# Patient Record
Sex: Female | Born: 1960 | Race: Black or African American | Hispanic: No | Marital: Single | Smoking: Never smoker
Health system: Southern US, Community
[De-identification: ages and names within clinical notes are randomized; demographics above are authoritative.]

## PROBLEM LIST (undated history)

## (undated) ENCOUNTER — Emergency Department (HOSPITAL_COMMUNITY): Admission: EM | Disposition: A | Discharge status: Home / Self Care | Payer: 59

## (undated) DIAGNOSIS — D649 Anemia, unspecified: Secondary | ICD-10-CM

## (undated) DIAGNOSIS — F329 Major depressive disorder, single episode, unspecified: Secondary | ICD-10-CM

## (undated) DIAGNOSIS — I671 Cerebral aneurysm, nonruptured: Secondary | ICD-10-CM

## (undated) DIAGNOSIS — R42 Dizziness and giddiness: Secondary | ICD-10-CM

## (undated) DIAGNOSIS — G4730 Sleep apnea, unspecified: Secondary | ICD-10-CM

## (undated) DIAGNOSIS — T7840XA Allergy, unspecified, initial encounter: Secondary | ICD-10-CM

## (undated) DIAGNOSIS — E669 Obesity, unspecified: Secondary | ICD-10-CM

## (undated) DIAGNOSIS — F32A Depression, unspecified: Secondary | ICD-10-CM

## (undated) DIAGNOSIS — I10 Essential (primary) hypertension: Secondary | ICD-10-CM

## (undated) HISTORY — DX: Essential (primary) hypertension: I10

## (undated) HISTORY — DX: Allergy, unspecified, initial encounter: T78.40XA

## (undated) HISTORY — PX: SHOULDER SURGERY: SHX246

## (undated) HISTORY — DX: Major depressive disorder, single episode, unspecified: F32.9

## (undated) HISTORY — DX: Dizziness and giddiness: R42

## (undated) HISTORY — DX: Obesity, unspecified: E66.9

## (undated) HISTORY — DX: Cerebral aneurysm, nonruptured: I67.1

## (undated) HISTORY — DX: Anemia, unspecified: D64.9

## (undated) HISTORY — PX: ABDOMINAL HYSTERECTOMY: SHX81

## (undated) HISTORY — PX: WISDOM TOOTH EXTRACTION: SHX21

## (undated) HISTORY — DX: Depression, unspecified: F32.A

## (undated) HISTORY — DX: Sleep apnea, unspecified: G47.30

## (undated) HISTORY — PX: KNEE ARTHROSCOPY: SUR90

---

## 1996-12-10 HISTORY — PX: BREAST REDUCTION SURGERY: SHX8

## 1998-09-02 ENCOUNTER — Emergency Department (HOSPITAL_COMMUNITY): Admission: EM | Admit: 1998-09-02 | Discharge: 1998-09-02 | Payer: Self-pay | Admitting: Emergency Medicine

## 1998-11-16 ENCOUNTER — Other Ambulatory Visit: Admission: RE | Admit: 1998-11-16 | Discharge: 1998-11-16 | Payer: Self-pay | Admitting: Obstetrics and Gynecology

## 1998-12-02 ENCOUNTER — Emergency Department (HOSPITAL_COMMUNITY): Admission: EM | Admit: 1998-12-02 | Discharge: 1998-12-02 | Payer: Self-pay | Admitting: *Deleted

## 1999-11-17 ENCOUNTER — Emergency Department (HOSPITAL_COMMUNITY): Admission: EM | Admit: 1999-11-17 | Discharge: 1999-11-17 | Payer: Self-pay | Admitting: *Deleted

## 1999-12-13 ENCOUNTER — Other Ambulatory Visit: Admission: RE | Admit: 1999-12-13 | Discharge: 1999-12-13 | Payer: Self-pay | Admitting: Obstetrics and Gynecology

## 2000-01-03 ENCOUNTER — Emergency Department (HOSPITAL_COMMUNITY): Admission: EM | Admit: 2000-01-03 | Discharge: 2000-01-04 | Payer: Self-pay | Admitting: Emergency Medicine

## 2000-12-31 ENCOUNTER — Other Ambulatory Visit: Admission: RE | Admit: 2000-12-31 | Discharge: 2000-12-31 | Payer: Self-pay | Admitting: Obstetrics and Gynecology

## 2001-06-23 ENCOUNTER — Encounter (INDEPENDENT_AMBULATORY_CARE_PROVIDER_SITE_OTHER): Payer: Self-pay | Admitting: Specialist

## 2001-06-23 ENCOUNTER — Inpatient Hospital Stay (HOSPITAL_COMMUNITY): Admission: RE | Admit: 2001-06-23 | Discharge: 2001-06-27 | Payer: Self-pay | Admitting: Obstetrics and Gynecology

## 2001-06-26 ENCOUNTER — Encounter: Payer: Self-pay | Admitting: Obstetrics and Gynecology

## 2002-02-04 ENCOUNTER — Other Ambulatory Visit: Admission: RE | Admit: 2002-02-04 | Discharge: 2002-02-04 | Payer: Self-pay | Admitting: Obstetrics and Gynecology

## 2002-06-22 ENCOUNTER — Ambulatory Visit (HOSPITAL_BASED_OUTPATIENT_CLINIC_OR_DEPARTMENT_OTHER): Admission: RE | Admit: 2002-06-22 | Discharge: 2002-06-22 | Payer: Self-pay | Admitting: Orthopedic Surgery

## 2002-09-24 ENCOUNTER — Emergency Department (HOSPITAL_COMMUNITY): Admission: EM | Admit: 2002-09-24 | Discharge: 2002-09-24 | Payer: Self-pay | Admitting: Emergency Medicine

## 2003-04-01 ENCOUNTER — Other Ambulatory Visit: Admission: RE | Admit: 2003-04-01 | Discharge: 2003-04-01 | Payer: Self-pay | Admitting: Obstetrics and Gynecology

## 2006-01-24 ENCOUNTER — Other Ambulatory Visit: Admission: RE | Admit: 2006-01-24 | Discharge: 2006-01-24 | Payer: Self-pay | Admitting: Obstetrics and Gynecology

## 2006-02-12 ENCOUNTER — Encounter: Admission: RE | Admit: 2006-02-12 | Discharge: 2006-02-12 | Payer: Self-pay | Admitting: Obstetrics and Gynecology

## 2006-12-24 ENCOUNTER — Ambulatory Visit: Payer: Self-pay | Admitting: Family Medicine

## 2007-01-11 ENCOUNTER — Emergency Department (HOSPITAL_COMMUNITY): Admission: EM | Admit: 2007-01-11 | Discharge: 2007-01-11 | Payer: Self-pay | Admitting: Family Medicine

## 2007-01-24 ENCOUNTER — Emergency Department (HOSPITAL_COMMUNITY): Admission: EM | Admit: 2007-01-24 | Discharge: 2007-01-24 | Payer: Self-pay | Admitting: Family Medicine

## 2007-06-24 ENCOUNTER — Ambulatory Visit: Payer: Self-pay | Admitting: Family Medicine

## 2007-08-11 ENCOUNTER — Emergency Department (HOSPITAL_COMMUNITY): Admission: EM | Admit: 2007-08-11 | Discharge: 2007-08-11 | Payer: Self-pay | Admitting: Emergency Medicine

## 2007-09-09 ENCOUNTER — Ambulatory Visit: Payer: Self-pay | Admitting: Family Medicine

## 2008-03-15 ENCOUNTER — Ambulatory Visit: Payer: Self-pay | Admitting: Family Medicine

## 2008-03-23 ENCOUNTER — Encounter: Admission: RE | Admit: 2008-03-23 | Discharge: 2008-03-23 | Payer: Self-pay | Admitting: Obstetrics and Gynecology

## 2008-07-26 ENCOUNTER — Ambulatory Visit: Payer: Self-pay | Admitting: Family Medicine

## 2008-07-30 ENCOUNTER — Ambulatory Visit: Payer: Self-pay | Admitting: Cardiovascular Disease

## 2008-08-12 ENCOUNTER — Ambulatory Visit: Payer: Self-pay

## 2008-08-12 ENCOUNTER — Encounter: Payer: Self-pay | Admitting: Cardiovascular Disease

## 2008-08-13 ENCOUNTER — Ambulatory Visit: Payer: Self-pay

## 2009-04-29 ENCOUNTER — Emergency Department (HOSPITAL_COMMUNITY): Admission: EM | Admit: 2009-04-29 | Discharge: 2009-04-29 | Payer: Self-pay | Admitting: Emergency Medicine

## 2009-06-28 ENCOUNTER — Encounter: Admission: RE | Admit: 2009-06-28 | Discharge: 2009-08-04 | Payer: Self-pay | Admitting: Orthopedic Surgery

## 2010-11-08 ENCOUNTER — Emergency Department (HOSPITAL_COMMUNITY)
Admission: EM | Admit: 2010-11-08 | Discharge: 2010-11-08 | Payer: Self-pay | Source: Home / Self Care | Admitting: Family Medicine

## 2010-12-31 ENCOUNTER — Encounter: Payer: Self-pay | Admitting: Obstetrics and Gynecology

## 2011-04-24 NOTE — Assessment & Plan Note (Signed)
[facility]                            CARDIOLOGY OFFICE NOTE   NAME:Sewell, SYNDEY JASKOLSKI                      MRN:          [id number]  DATE:07/30/2008                            DOB:          11/13/1961    Ms. Pirozzi is a 50 year old long-term patient of Dr. Susann Givens.  She has  coronary risk factors including central obesity, hypertension, and  positive family history.   She has been having substernal chest pain.  The pain has been  particularly bad the last Saturday, Sunday, Monday.  The pain is  exertional.  It is sharp.  It is in the left side of her chest.  It  radiates to her left arm.  There is no associated diaphoresis.  She has  had exertional dyspnea; however, she is extremely sedentary.  She has  been overweight for some time and has had breast reduction.  She is  currently over 250 pounds.   She seems to think the pain is improving slightly, but she still has it  and it continues to be somewhat exertional.   Does not have a pleuritic component.  There is no cough.   No one in the family has been sick and she has not had any significant  fevers.  She has never had a stress test or workup of her heart.  Dr.  Susann Givens was concerned because of her chest pain and the fact that she  had nonspecific ST-T wave changes and left axis deviation with a  question of LVH on her ECG.   Her review of systems is otherwise negative.   Her past medical history is remarkable for hypertension, on therapy for  over 5 years, previous hysterectomy, previous C-section x2, and previous  breast reduction.   Cholesterol status is unknown.   She only drinks on occasion and does not smoke.  The patient is single.  She has 2 children.  Her 65 year old daughter lives with her.  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   She admits to being fairly sedentary and primarily watching TV when she  does not work.   Family history is positive with  father dying of cancer at age 55.  Mother still live and grandmother having premature coronary disease.   She is on Tiazac dose unknown, hydrochlorothiazide dose unknown.   She has no allergies.   PHYSICAL EXAMINATION:  GENERAL:  Remarkable for an overweight black  female in no distress.  VITAL SIGNS:  Blood pressure is 130/80, pulse is 80 and regular,  respiratory rate is 14 afebrile, and weight is 254.  HEENT:  Unremarkable.  NECK:  Carotids are without bruit.  No lymphadenopathy, no JVP  elevation.  LUNGS:  Clear.  Good diaphragmatic motion.  No wheezing.  HEART:  S1 and S2 with distant heart sounds.  PMI not palpable.  ABDOMEN:  Benign.  Bowel sounds positive.  No AAA.  No tenderness.  No  bruit.  No hepatosplenomegaly.  No hepatojugular reflux.  No tenderness.  EXTREMITIES:  Distal pulses intact.  No edema.  NEURO:  Nonfocal.  SKIN:  Warm and dry.  MUSCULOSKELETAL:  No muscular weakness.   EKG shows sinus rhythm with borderline LVH, nonspecific ST-T wave  changes.   IMPRESSION:  1. Chest pain with exertional component risk factors and an abnormal      EKG.  The patient will have a followup stress Myoview.  2. Dyspnea in the setting of probable hypertension and left      ventricular hypertrophy.  Check 2-D echocardiogram.  Rule out      hypertrophic cardiomyopathy and assess diastolic dysfunction.  3. Central obesity.  The patient needs more dietary counseling.  She      needs to get involved with an exercise program.  As long as her      stress test and echo are normal, she will be cleared to do any type      of exercise.  I encouraged her to join the [facility].   As long as her stress test and echo are normal, she will follow up with  Dr. Susann Givens who has known her for 14 years.     Noralyn Pick. Eden Emms, MD, [facility]  Electronically Signed    PCN/MedQ  DD: 07/30/2008  DT: 07/31/2008  Job #: [id number]

## 2011-04-27 NOTE — Op Note (Signed)
[place]  Patient:    Stephanie Best, Stephanie Best Visit Number: [id number] MRN: [id number]          Service Type: DSU Location: [facility] Attending Physician:  Alinda Deem Dictated by:   Alinda Deem, M.D. Proc. Date: 06/22/02 Admit Date:  06/22/2002                             Operative Report  PREOPERATIVE DIAGNOSIS:  Right knee lateral meniscal tear, anterior horn.  POSTOPERATIVE DIAGNOSIS:  Right knee lateral meniscal tear, anterior horn.  OPERATION PERFORMED:  Right knee arthroscopic lateral meniscectomy.  SURGEON:  Alinda Deem, M.D.  ASSISTANT:  Dorthula Matas, P.A.-C.  ANESTHESIA:  General LMA.  ESTIMATED BLOOD LOSS:  Minimal.  FLUID REPLACEMENT:  700 cc of crystalloid.  DRAINS:  None.  TOURNIQUET TIME:  None.  INDICATIONS FOR PROCEDURE:  The patient is a 50 year old woman whom I have followed for right knee pain now for a number of months.  She has an MRI proven anterior horn of the lateral meniscus tear that has failed to respond to conservative treatment with physical therapy, activity modification, anti-inflammatory medicines and she now desires elective removal of the lateral meniscal tear to relieve pain and increase function.  DESCRIPTION OF PROCEDURE:  The patient was identified by arm band and taken to the operating room at [facility] where the appropriate anesthetic monitors were attached and general LMA anesthesia induced with the patient in the supine position.  The lateral post applied to the table and the right lower extremity prepped and draped in the usual sterile fashion from the ankle to the midthigh.  Using a #11 blade, standard inferomedial and inferolateral peripatellar portals were then made allowing introduction of the arthroscope through the inferolateral portal and the outflow through the inferomedial portal.  We immediately identified mild inflammation of the synovium  which required light debridement in the suprapatellar pouch region to enhance visualization.  The articular cartilage was actually in quite good shape to the patella, trochlea, medial and lateral compartments.  Medial meniscus was intact as were the cruciate ligaments on the lateral side.  We identified anterior to straight lateral tearing of the lateral meniscus, radial tears. These were debrided back to stable margins with a 3.5 mm Gator sucker shaver as well as some tearing of the root of the lateral meniscus posteriorly and this was also debrided back to a stable margin.  The gutters were cleared, the popliteus was intact.  The knee was then washed out with normal saline solution and the arthroscopic instruments removed.  A dressing of Xeroform, 4 x 4 dressing sponges, Webril and an Ace wrap applied.  The patient was awakened and taken to the recovery room without difficulty. Dictated by:   Alinda Deem, M.D. Attending Physician:  Alinda Deem DD:  06/22/02 TD:  06/23/02 Job: [id number]

## 2011-04-27 NOTE — Op Note (Signed)
[facility]  Patient:    Stephanie Best, Stephanie Best                      MRN: [id number] Proc. Date: 06/23/01 Adm. Date:  [id number] Attending:  Frederich Balding                           Operative Report  PREOPERATIVE DIAGNOSES:       Menorrhagia and pelvic pain secondary to pelvic adhesion process.  POSTOPERATIVE DIAGNOSES:      Menorrhagia and pelvic pain secondary to pelvic adhesion process.  OPERATIVE PROCEDURE:          Exploratory laparotomy with total abdominal hysterectomy and lysis of adhesions.  SURGEON:                      Juluis Mire, M.D.  ASSISTANTWilley Blade, M.D.  ANESTHESIA:                   General endotracheal.  ESTIMATED BLOOD LOSS:         400 to 500 cc.  INTRAOPERATIVE BLOOD REPLACEMENT:  None.  COMPLICATIONS:                None.  INDICATIONS:                  Indications are dictated in history and physical.  DESCRIPTION OF PROCEDURE:     Patient was taken to the OR and placed in supine position.  After a satisfactory level of general endotracheal anesthesia was obtained, the abdomen, perineum and vagina were prepped out with Betadine and draped as a sterile field.  A prior low transverse skin incision was then identified and excised.  The incision was then extended through the subcutaneous tissue.  The anterior rectus fascia was identified and entered sharply and the incision in the fascia extended laterally.  Fascia was then taken off the muscle superiorly and inferiorly.  Rectus muscle was separated in the midline.  We were able to enter the peritoneum at that point and the incision in the peritoneum was extended both superiorly and inferiorly.  She did have marked adhesions from the anterior uterine wall to the anterior abdomen.  At this point in time, we looked at the right adnexa which was densely adherent with a muchly foreshortened right round ligament.  This round ligament was  clamped, cut and suture-ligated with 0 Vicryl.  We then were able to sharply dissect some of the adhesions between the anterior aspect of the uterus.  The uteroovarian pedicle was isolated, clamped, cut and doubly ligated, first with a free tie of 0 Vicryl, then a suture ligature of 0 Vicryl.  We then at this point in time put an OConnor-OSullivan retractor in place and packed the bowel contents superiorly out of the pelvic cavity. Next, the left round ligament was identified, clamped, cut and suture-ligated with 0 Vicryl.  The left uteroovarian pedicle was isolated, clamped, cut and doubly ligated, first with a free tie of 0 Vicryl, then a suture ligature of 0 Vicryl.  We then were able to sharply develop the bladder flap.  This again was markedly adherent.  Using the clamp-cut-and-tie technique with suture ligatures of 0 Vicryl, the parametrium was serially separated from the sides of the  uterus.  The uterosacral ligaments were then clamped, cut and suture-ligated with 0 Vicryl.  The vaginal angles were isolated, clamped, cut and the intervening vaginal mucosa was excised and the uterus passed off the operative field.  Vaginal angles were secured with suture ligatures of 0 Vicryl.  Vaginal cuff was then closed with a running-locking suture of 0 Vicryl.  Some bleeding was noted from the posterior aspect of the cuff and brought under control with figure-of-eights of 0 Vicryl.  With this, we had good hemostasis.  We thoroughly irrigated the pelvis.  We had excellent hemostasis and good support of the vaginal cuff.  Both ovaries were hemostatically intact and out of the pelvis.  She had a relatively shallow cul-de-sac and this was not ablated.  The appendix was visualized and noted to be unremarkable.  All packs and self-retaining retractors were removed.  The peritoneum and muscle were closed with a running suture of 3-0 Vicryl, fascia was closed with a running suture of 0 Panacryl, subcu was  closed with 3-0 Vicryl and skin was closed with staples and Steri-Strips.  Sponge, instrument and needle count was reported as correct by circulating nurse x 2. Foley catheter remained clear at the time of closure.  Patient tolerated the procedure well and once extubated, returned to the recovery room in good condition.DD:  06/23/01 TD:  06/23/01 Job: [id number]

## 2011-04-27 NOTE — H&P (Signed)
[facility]  Patient:    Stephanie Best, Stephanie Best                      MRN: [id number] Adm. Date:  [id number] Attending:  Frederich Balding                         History and Physical  HISTORY OF PRESENT ILLNESS:   The patient is a 50 year old gravida 2, para 2, married black female, who presents for a total abdominal hysterectomy.  In relation to the present admission, the patient has been having trouble with increasing menorrhagia and dysmenorrhea unresponsive to conservative management.  This has been associated with increasing anemia.  The patient was seen on January 22.  At that time she was describing two days of heavy flow, changing pads and tampons every hour, with increasing clots and cramping.  Her hemoglobin was noted to be at that time 8.0.  She was placed on iron sulfate supplementation and low-dose birth control pills for management.  We gradually increased her hemoglobin, but her blood pressure went up with the birth control pills and these had to subsequently be discontinued.  She began experiencing increasing bleeding again.  Ultrasound evaluation was basically unremarkable.  She had a previous laparoscopic evaluation for bilateral tubal ligation back in 1997 with the finding of extensive pelvic adhesions.  In view of this, the patient now presents for total abdominal hysterectomy.  ALLERGIES:                    She is allergic to CODEINE.  MEDICATIONS:                  None.  PAST MEDICAL HISTORY:         Usual childhood diseases, no significant sequelae. PAST SURGICAL HISTORY:        She has had two prior cesarean sections.  She has had bilateral breast reduction and previous laparoscopic bilateral tubal ligation. FAMILY HISTORY:               Noncontributory.  SOCIAL HISTORY:               No tobacco or alcohol use.  REVIEW OF SYSTEMS:            Noncontributory.  PHYSICAL EXAMINATION:  VITAL SIGNS:                  Patient  afebrile with stable vital signs.  HEENT:                        Patient is normocephalic.  Pupils equal, round and reactive to light and accommodation.  Extraocular movements are intact, sclerae and conjunctivae are clear.  Oropharynx clear.  NECK:                         Without thyromegaly.  BREASTS:                      Bilateral reduction surgery is noted.  No dominant mass, adenopathy, or nipple discharge.  CHEST:                        Lungs are clear.  CARDIAC:  Regular rhythm and rate, no murmurs or gallops.  ABDOMEN:                      Benign.  PELVIC:                       Normal external genitalia.  Vaginal mucosa clear.  Cervix unremarkable.  Uterus upper limits of normal size, slightly irregular, and somewhat fixed.  Adnexa unremarkable.  EXTREMITIES:                  Trace edema.  NEUROLOGIC:                   Grossly within normal limits.  IMPRESSION:                   Menorrhagia with associated dysmenorrhea, unresponsive to conservative management.  PLAN:                         Patient to undergo total abdominal hysterectomy. Risks of surgery have been discussed, including the risks of anesthesia.  The risk of infection.  The risk of hemorrhage, necessitating transfusion, with the risk of AIDS or hepatitis.  The risk of injury to adjacent organs, including bladder, bowel, or ureters, that could require further exploratory surgery.  The risks of deep venous thrombosis or pulmonary embolus.  The patient expressed an understanding of the indications and risks. DD:  06/23/01 TD:  06/23/01 Job: [id number]

## 2011-04-27 NOTE — Discharge Summary (Signed)
[facility]  Patient:    Stephanie Best, Stephanie Best                      MRN: [id number] Adm. Date:  [id number] Disc. Date: [id number] Attending:  Frederich Balding                           Discharge Summary  ADMITTING DIAGNOSES:          Menorrhagia and pelvic pain secondary to pelvic adhesions.  DISCHARGE DIAGNOSES:          Menorrhagia and pelvic pain secondary to pelvic adhesions.  OPERATIVE PROCEDURE:          Exploratory laparotomy with total abdominal hysterectomy and lysis of adhesions.  HISTORY AND PHYSICAL:         For complete history and physical please see dictated note.  HOSPITAL COURSE:              Patient was admitted, underwent total abdominal hysterectomy.  Extensive abdominopelvic adhesions were noted.  Ovaries were left in place.  Pathology on the surgical specimen is still pending. Postoperatively her postoperative hemoglobin was 9.2.  Preoperative was 11.6. Postoperatively her complication was a postoperative problem with abdominal distention as well as nausea and vomiting.  We did check electrolytes.  Her potassium was slightly depressed at 3.4.  Her CBC on the 18th showed hemoglobin 8.9 which was considered stable.  Her concern was that she was developing small bowel obstruction at that time.  However, abdominal x-rays revealed a large amount of air and fecal material in the right and transverse colon with gas down to the rectum.  There was no distended small bowel.  We subsequently treated this with a combination of suppositories and enemas with good results.  On her fourth postoperative day she was afebrile with stable vital signs.  Her abdomen was soft, nontender.  She was passing flatus.  Her low transverse incision was intact.  She was having no active vaginal bleeding.  She was tolerating liquids at that time.  The decision was to discharge home.  COMPLICATIONS:                Noted above.  CONDITION ON DISCHARGE:        Stable.  DISPOSITION:                  Patient is discharged home on Demerol if she needs it for pain, iron sulfate supplementation.  She is to call with increasing nausea, vomiting, abdominal pain, active vaginal bleeding, or fever.  She is to avoid heavy lifting, vaginal entry, driving a car.  She will follow-up in the office on Monday to reevaluate the incision, GI function, as well as remove staples. DD:  06/27/01 TD:  06/27/01 Job: [id number]

## 2011-05-12 ENCOUNTER — Encounter: Payer: Self-pay | Admitting: Family Medicine

## 2011-05-12 DIAGNOSIS — E669 Obesity, unspecified: Secondary | ICD-10-CM | POA: Insufficient documentation

## 2011-05-15 ENCOUNTER — Ambulatory Visit (INDEPENDENT_AMBULATORY_CARE_PROVIDER_SITE_OTHER): Payer: Medicaid Other | Admitting: Family Medicine

## 2011-05-15 ENCOUNTER — Encounter: Payer: Self-pay | Admitting: Family Medicine

## 2011-05-15 VITALS — BP 140/88 | HR 70 | Ht 64.5 in | Wt 229.0 lb

## 2011-05-15 DIAGNOSIS — I10 Essential (primary) hypertension: Secondary | ICD-10-CM | POA: Insufficient documentation

## 2011-05-15 DIAGNOSIS — Z0000 Encounter for general adult medical examination without abnormal findings: Secondary | ICD-10-CM

## 2011-05-15 DIAGNOSIS — J45909 Unspecified asthma, uncomplicated: Secondary | ICD-10-CM | POA: Insufficient documentation

## 2011-05-15 DIAGNOSIS — E669 Obesity, unspecified: Secondary | ICD-10-CM

## 2011-05-15 DIAGNOSIS — Z79899 Other long term (current) drug therapy: Secondary | ICD-10-CM

## 2011-05-15 LAB — POCT URINALYSIS DIPSTICK
Glucose, UA: NEGATIVE
Leukocytes, UA: NEGATIVE
Protein, UA: NEGATIVE
Urobilinogen, UA: NEGATIVE

## 2011-05-15 LAB — CBC WITH DIFFERENTIAL/PLATELET
Eosinophils Absolute: 0.1 10*3/uL (ref 0.0–0.7)
HCT: 41.5 % (ref 36.0–46.0)
Hemoglobin: 14.2 g/dL (ref 12.0–15.0)
Lymphs Abs: 2.2 10*3/uL (ref 0.7–4.0)
MCH: 28.4 pg (ref 26.0–34.0)
MCHC: 34.2 g/dL (ref 30.0–36.0)
Monocytes Absolute: 0.3 10*3/uL (ref 0.1–1.0)
Monocytes Relative: 5 % (ref 3–12)
Neutrophils Relative %: 58 % (ref 43–77)
RBC: 5 MIL/uL (ref 3.87–5.11)

## 2011-05-15 LAB — LIPID PANEL
Total CHOL/HDL Ratio: 3 Ratio
VLDL: 14 mg/dL (ref 0–40)

## 2011-05-15 LAB — BASIC METABOLIC PANEL
BUN: 9 mg/dL (ref 6–23)
CO2: 28 mEq/L (ref 19–32)
Calcium: 9.3 mg/dL (ref 8.4–10.5)
Glucose, Bld: 89 mg/dL (ref 70–99)
Potassium: 3.7 mEq/L (ref 3.5–5.3)
Sodium: 137 mEq/L (ref 135–145)

## 2011-05-15 MED ORDER — HYDROCHLOROTHIAZIDE 12.5 MG PO CAPS: 12.5000 mg | ORAL_CAPSULE | Freq: Every day | ORAL | Status: DC

## 2011-05-15 MED ORDER — DILTIAZEM HCL ER BEADS 120 MG PO CP24: 120.0000 mg | ORAL_CAPSULE | Freq: Every day | ORAL | Status: DC

## 2011-05-15 NOTE — Patient Instructions (Signed)
Stay on your present medications. Call Hospice for bereavement counseling for both you and your daughter

## 2011-05-15 NOTE — Progress Notes (Signed)
  Subjective:    Patient ID: Stephanie Best, female    DOB: 07-Dec-1961, 50 y.o.   MRN: [id number]  HPI he is here for complete examination. She continues on her blood pressure medications area she has had no difficulty with her asthma. Her social and family history were reviewed and are in the chart. She is in school at [employer] today in early childhood development. Her immunizations are up-to-date. She had a mammogram in 2010 as well as Pap smear. He does wear her seatbelt but does not exercise regularly. He has 2 children and one that was killed New Year's Eve. He was 26. She is still dealing with this and having some difficulties dealing with his death.    Review of Systems  Constitutional: Negative.   HENT: Negative.   Eyes: Negative.   Respiratory: Negative.   Cardiovascular: Negative.   Gastrointestinal: Negative.   Genitourinary: Negative.   Musculoskeletal: Negative.   Skin: Negative.   Neurological: Negative.   Hematological: Negative.        Objective:   Physical ExamBP 140/88  Pulse 70  Ht 5' 4.5" (1.638 m)  Wt 229 lb (103.874 kg)  BMI 38.70 kg/m2  General Appearance:    Alert, cooperative, no distress, appears stated age  Head:    Normocephalic, without obvious abnormality, atraumatic  Eyes:    PERRL, conjunctiva/corneas clear, EOM's intact, fundi    benign  Ears:    Normal TM's and external ear canals  Nose:   Nares normal, mucosa normal, no drainage or sinus   tenderness  Throat:   Lips, mucosa, and tongue normal; teeth and gums normal  Neck:   Supple, no lymphadenopathy;  thyroid:  no   enlargement/tenderness/nodules; no carotid   bruit or JVD  Back:    Spine nontender, no curvature, ROM normal, no CVA     tenderness  Lungs:     Clear to auscultation bilaterally without wheezes, rales or     ronchi; respirations unlabored  Chest Wall:    No tenderness or deformity   Heart:    Regular rate and rhythm, S1 and S2 normal, no murmur, rub   or gallop  Breast Exam:     Deferred to GYN  Abdomen:     Soft, non-tender, nondistended, normoactive bowel sounds,    no masses, no hepatosplenomegaly  Genitalia:    Deferred to GYN     Extremities:   No clubbing, cyanosis or edema  Pulses:   2+ and symmetric all extremities  Skin:   Skin color, texture, turgor normal, no rashes or lesions  Lymph nodes:   Cervical, supraclavicular, and axillary nodes normal  Neurologic:   CNII-XII intact, normal strength, sensation and gait; reflexes 2+ and symmetric throughout          Psych:   Normal mood, affect, hygiene and grooming. I had a long talk with her concerning death and dying and her response to this. Explained that she is essentially having a normal response.          Assessment & Plan:  Hypertension. Obesity. Asthma. Recent death of her son Can you on present medications. Go to hospice for bereavement counseling probable herself and her daughter.

## 2011-05-15 NOTE — Progress Notes (Signed)
Addended by: Lavell Islam on: 05/15/2011 10:51 AM   Modules accepted: Orders

## 2011-05-16 ENCOUNTER — Telehealth: Payer: Self-pay

## 2011-05-16 NOTE — Telephone Encounter (Signed)
Informed pt that labs look good

## 2011-09-10 ENCOUNTER — Encounter: Payer: Self-pay | Admitting: Family Medicine

## 2011-09-10 ENCOUNTER — Ambulatory Visit (INDEPENDENT_AMBULATORY_CARE_PROVIDER_SITE_OTHER): Payer: Medicaid Other | Admitting: Medical

## 2011-09-10 ENCOUNTER — Encounter: Payer: Self-pay | Admitting: Medical

## 2011-09-10 VITALS — BP 132/100 | HR 72 | Temp 98.0°F | Resp 16 | Ht 64.0 in | Wt 239.0 lb

## 2011-09-10 DIAGNOSIS — I10 Essential (primary) hypertension: Secondary | ICD-10-CM

## 2011-09-10 DIAGNOSIS — J069 Acute upper respiratory infection, unspecified: Secondary | ICD-10-CM

## 2011-09-10 DIAGNOSIS — J019 Acute sinusitis, unspecified: Secondary | ICD-10-CM

## 2011-09-10 MED ORDER — AMOXICILLIN 875 MG PO TABS: 875.0000 mg | ORAL_TABLET | Freq: Two times a day (BID) | ORAL | Status: AC

## 2011-09-10 NOTE — Patient Instructions (Signed)
Rest, drink plenty of water, and you can use OTC Mucinex DM or Coricidin HBP for congestion.    Consider nasal saline spray and salt water gargles.   Begin Amoxicillin for sinuses, twice daily x 10 days.    Call or return if worse or not improving in 3-4 days.  Call if fever >102.  Take your blood pressure medication today.

## 2011-09-10 NOTE — Progress Notes (Signed)
Subjective:     Stephanie Best is a 50 y.o. female who presents for evaluation of illness.  Been sick with sinus pressure for 2 weeks, but symptoms worsened in the past 24 hours.  She notes chills, congestion, sinus pressure, bad cough, hoarseness from post nasal drainage, swollen glands, sore throat, and productive sputum.  Using Zyrtec only.  No sick contacts. She notes hx/o sinus and allergy problems.  No other aggravating or relieving factors.  No other c/o.  The following portions of the patient's history were reviewed and updated as appropriate: allergies, current medications, past family history, past medical history, past social history, past surgical history and problem list.  Past Medical History  Diagnosis Date  . Asthma   . Hypertension   . Obesity     Review of Systems Constitutional: denies fever Skin: denies rash HEENT: denies ear pain, itchy watery eyes Cardiovascular: denies chest pain Lungs: denies wheezing Abdomen: denies abdominal pain, nausea, vomiting, diarrhea GU: denies dysuria  Objective:   Filed Vitals:   09/10/11 1154  BP: 132/100  Pulse: 72  Temp: 98 F (36.7 C)  Resp: 16    General appearance: Alert, WD/WN, no distress, ill appearing, lying on exam table with hoody sweatshirt on                             Skin: warm, no rash                           Head: +moderate sinus tenderness,                            Eyes: conjunctiva normal, corneas clear, PERRLA                            Ears: retracted TMs, external ear canals normal                          Nose: septum midline, turbinates swollen, with erythema and clear discharge             Mouth/throat: MMM, tongue normal, mild pharyngeal erythema                           Neck: supple, no adenopathy, no thyromegaly, nontender                          Heart: RRR, normal S1, S2, no murmurs                         Lungs: CTA bilaterally, no wheezes, rales, or rhonchi      Assessment:    Encounter Diagnoses  Name Primary?  . Acute sinus infection Yes  . Upper respiratory infection   . Unspecified essential hypertension       Plan:   Prescription given for Amoxicillin.  Can use OTC Mucinex DM or Coricidin HBP for congestion.  Tylenol or Ibuprofen OTC for fever and malaise.  Discussed symptomatic relief, nasal saline, and call or return if worse or not improving in 2-3 days.   Gave note for school.  HTN - advised to take her medication daily, discussed importance of good BP control.  Recheck BP  in 1wk.

## 2011-09-18 DIAGNOSIS — Z0279 Encounter for issue of other medical certificate: Secondary | ICD-10-CM

## 2011-09-21 LAB — I-STAT 8, (EC8 V) (CONVERTED LAB)
Bicarbonate: 26.7 — ABNORMAL HIGH
HCT: 47 — ABNORMAL HIGH
Hemoglobin: 16 — ABNORMAL HIGH
Operator id: 116391
TCO2: 28
pCO2, Ven: 52.1 — ABNORMAL HIGH

## 2011-09-25 ENCOUNTER — Telehealth: Payer: Self-pay | Admitting: Medical

## 2011-09-25 ENCOUNTER — Other Ambulatory Visit: Payer: Self-pay | Admitting: Medical

## 2011-09-25 MED ORDER — FLUCONAZOLE 150 MG PO TABS: 150.0000 mg | ORAL_TABLET | Freq: Once | ORAL | Status: DC

## 2011-09-25 NOTE — Telephone Encounter (Signed)
Patients medication was sent to the pharmacy and patient was notified of this. CLS

## 2011-09-25 NOTE — Telephone Encounter (Signed)
Message copied by Janeice Robinson on Tue Sep 25, 2011  3:53 PM ------      Message from: [place], Kermit Balo      Created: Tue Sep 25, 2011 12:25 PM       Pt called in with symptoms of yeast infection. She was just on Amoxicillin few weeks ago per me.  Diflucan sent.  If not improving or worse, recheck.

## 2011-10-03 ENCOUNTER — Inpatient Hospital Stay (INDEPENDENT_AMBULATORY_CARE_PROVIDER_SITE_OTHER)
Admission: RE | Admit: 2011-10-03 | Discharge: 2011-10-03 | Disposition: A | Discharge status: Home / Self Care | Payer: Medicaid Other | Source: Ambulatory Visit | Attending: Family Medicine | Admitting: Family Medicine

## 2011-10-03 DIAGNOSIS — J069 Acute upper respiratory infection, unspecified: Secondary | ICD-10-CM

## 2011-11-19 ENCOUNTER — Encounter: Payer: Self-pay | Admitting: Family Medicine

## 2011-11-19 ENCOUNTER — Ambulatory Visit (INDEPENDENT_AMBULATORY_CARE_PROVIDER_SITE_OTHER): Payer: Medicaid Other | Admitting: Family Medicine

## 2011-11-19 VITALS — BP 120/80 | HR 103 | Temp 98.8°F | Wt 234.0 lb

## 2011-11-19 DIAGNOSIS — J209 Acute bronchitis, unspecified: Secondary | ICD-10-CM

## 2011-11-19 DIAGNOSIS — K529 Noninfective gastroenteritis and colitis, unspecified: Secondary | ICD-10-CM

## 2011-11-19 DIAGNOSIS — H6693 Otitis media, unspecified, bilateral: Secondary | ICD-10-CM

## 2011-11-19 DIAGNOSIS — H6690 Otitis media, unspecified, unspecified ear: Secondary | ICD-10-CM

## 2011-11-19 DIAGNOSIS — K5289 Other specified noninfective gastroenteritis and colitis: Secondary | ICD-10-CM

## 2011-11-19 MED ORDER — LEVOFLOXACIN 500 MG PO TABS: 500.0000 mg | ORAL_TABLET | Freq: Every day | ORAL | Status: AC

## 2011-11-19 MED ORDER — FLUCONAZOLE 150 MG PO TABS: 150.0000 mg | ORAL_TABLET | Freq: Once | ORAL | Status: AC

## 2011-11-19 NOTE — Progress Notes (Signed)
  Subjective:    Patient ID: Stephanie Best, female    DOB: October 15, 1961, 50 y.o.   MRN: [id number]  HPI Approximately 4 days ago she developed a cough with fatigue and she then developed a fever,, nasal congestion, diarrhea ,chest congestion the next day that lasts until yesterday. Also complains of difficulty with hearing. She does not smoke   Review of Systems     Objective:   Physical Exam alert and in no distress. Tympanic membranes are dull and red and canals are normal. Throat is clear. Tonsils are normal. Neck is supple without adenopathy or thyromegaly. Cardiac exam shows a regular sinus rhythm without murmurs or gallops. Lungs are clear to auscultation.        Assessment & Plan:  BOM. Bronchitis. Gastroenteritis I will place her on Levaquin. Recommend Tylenol or Advil for aches and pains and Lomotil for her diarrhea. Use the Diflucan as directed

## 2011-11-19 NOTE — Patient Instructions (Addendum)
Take all the antibiotic and use Lomotil for the diarrhea. Reschedule she evaluate the other problems. Use Diflucan now and in 3 or 4 days

## 2011-11-27 ENCOUNTER — Ambulatory Visit (INDEPENDENT_AMBULATORY_CARE_PROVIDER_SITE_OTHER): Payer: Medicaid Other | Admitting: Family Medicine

## 2011-11-27 ENCOUNTER — Encounter: Payer: Self-pay | Admitting: Family Medicine

## 2011-11-27 VITALS — BP 130/90 | HR 85 | Wt 234.0 lb

## 2011-11-27 DIAGNOSIS — M719 Bursopathy, unspecified: Secondary | ICD-10-CM

## 2011-11-27 DIAGNOSIS — M7552 Bursitis of left shoulder: Secondary | ICD-10-CM

## 2011-11-27 NOTE — Patient Instructions (Signed)
Increase your physical activity. [facility]. Make changes in your diet especially in regard to carbohydrates.

## 2011-11-27 NOTE — Progress Notes (Signed)
  Subjective:    Patient ID: Stephanie Best, female    DOB: [birth year], 50 y.o.   MRN: [id number]  HPI She complains of a one-month history of left shoulder pain. No history of injury, popping, numbness. She does complain of some tingling that radiates down her arm with abduction and external rotation. She also has concern over her weight. Continues on her blood pressure medications.   Review of Systems     Objective:   Physical Exam Pain on abduction and external rotation as well as to a lesser stent internal rotation. Slight tenderness over the ac joint. No laxity noted. Drop arm test negative.      Assessment & Plan:   1. Bursitis of left shoulder   2. Obesity, Class III, BMI 40-49.9 (morbid obesity)    I discussed options with her concerning the left shoulder and she has elected to have it injected. The left shoulder posteriorly was prepped with Betadine. 40 mg of Kenalog and 3 cc of Xylocaine was injected into the subacromial bursa. She obtained relatively quick relief of her symptoms. She will return here if the symptoms recur. I also discussed diet and exercise with her. Strongly encouraged her to become more physically active. She is to shoot towards 30 minutes 5 times per week. Also discussed dietary modification using the [place] or Mediterranean diet as a template.

## 2011-12-21 ENCOUNTER — Ambulatory Visit
Admission: RE | Admit: 2011-12-21 | Discharge: 2011-12-21 | Disposition: A | Discharge status: Home / Self Care | Payer: Medicaid Other | Source: Ambulatory Visit | Attending: Family Medicine | Admitting: Family Medicine

## 2011-12-21 ENCOUNTER — Ambulatory Visit (INDEPENDENT_AMBULATORY_CARE_PROVIDER_SITE_OTHER): Payer: Medicaid Other | Admitting: Family Medicine

## 2011-12-21 ENCOUNTER — Encounter: Payer: Self-pay | Admitting: Family Medicine

## 2011-12-21 VITALS — BP 142/100 | HR 74 | Wt 237.0 lb

## 2011-12-21 DIAGNOSIS — M25512 Pain in left shoulder: Secondary | ICD-10-CM

## 2011-12-21 DIAGNOSIS — M25519 Pain in unspecified shoulder: Secondary | ICD-10-CM

## 2011-12-21 NOTE — Progress Notes (Signed)
  Subjective:    Patient ID: Stephanie Best, female    DOB: 12-Mar-1961, 51 y.o.   MRN: [id number]  HPI She is here for recheck on left shoulder pain. She obtained only one weeks worth of value from the injection. She states that the pain is made worse with abduction and external rotation. She also has trouble sleeping on the left shoulder. This has been bothering her for approximately 3 months. No history of injury.   Review of Systems     Objective:   Physical Exam Full motion of the shoulder. No palpable tenderness. No laxity noted. Supraspinatus testing normal. Negative drop arm test. Juanetta Gosling and Neer's test is negative      Assessment & Plan:  Left shoulder pain. I will order an x-ray and send her for rehabilitation if the x-ray is negative

## 2011-12-21 NOTE — Patient Instructions (Signed)
If the x-ray is negative, we will refer you to physical therapy to see if this will help. If this doesn't help then we will get an MRI

## 2012-01-14 ENCOUNTER — Ambulatory Visit: Payer: Medicaid Other | Attending: Family Medicine | Admitting: Physical Therapy

## 2012-01-14 DIAGNOSIS — IMO0001 Reserved for inherently not codable concepts without codable children: Secondary | ICD-10-CM | POA: Insufficient documentation

## 2012-01-14 DIAGNOSIS — M25519 Pain in unspecified shoulder: Secondary | ICD-10-CM | POA: Insufficient documentation

## 2012-01-14 DIAGNOSIS — M25619 Stiffness of unspecified shoulder, not elsewhere classified: Secondary | ICD-10-CM | POA: Insufficient documentation

## 2012-01-23 ENCOUNTER — Ambulatory Visit: Payer: Medicaid Other | Admitting: Rehabilitation

## 2012-01-30 ENCOUNTER — Ambulatory Visit: Payer: Medicaid Other | Admitting: Physical Therapy

## 2012-02-06 ENCOUNTER — Ambulatory Visit: Payer: Medicaid Other | Admitting: Physical Therapy

## 2012-02-08 ENCOUNTER — Ambulatory Visit: Payer: Medicaid Other | Attending: Family Medicine | Admitting: Physical Therapy

## 2012-02-08 DIAGNOSIS — M25519 Pain in unspecified shoulder: Secondary | ICD-10-CM | POA: Insufficient documentation

## 2012-02-08 DIAGNOSIS — M25619 Stiffness of unspecified shoulder, not elsewhere classified: Secondary | ICD-10-CM | POA: Insufficient documentation

## 2012-02-08 DIAGNOSIS — IMO0001 Reserved for inherently not codable concepts without codable children: Secondary | ICD-10-CM | POA: Insufficient documentation

## 2012-02-11 ENCOUNTER — Ambulatory Visit: Payer: Medicaid Other | Admitting: Rehabilitation

## 2012-02-18 ENCOUNTER — Encounter: Payer: Self-pay | Admitting: Family Medicine

## 2012-02-18 ENCOUNTER — Ambulatory Visit (INDEPENDENT_AMBULATORY_CARE_PROVIDER_SITE_OTHER): Payer: Medicaid Other | Admitting: Family Medicine

## 2012-02-18 VITALS — BP 130/90 | HR 85 | Wt 239.0 lb

## 2012-02-18 DIAGNOSIS — M25519 Pain in unspecified shoulder: Secondary | ICD-10-CM

## 2012-02-18 DIAGNOSIS — M25512 Pain in left shoulder: Secondary | ICD-10-CM

## 2012-02-18 NOTE — Progress Notes (Signed)
  Subjective:    Patient ID: Stephanie Best, female    DOB: May 20, 1961, 50 y.o.   MRN: [id number]  HPI She is here for recheck on left shoulder pain. She has been through physical therapy and apparently no benefit was obtained from it. She is also had x-rays done and has tried anti-inflammatory medications without success.   Review of Systems     Objective:   Physical Exam Pain on motion of the shoulder in all directions. Drop arm test was painful. Neer's and Hawkin's test was also painful. No laxity noted.      Assessment & Plan:  Left shoulder pain. MRI will be ordered.

## 2012-02-20 ENCOUNTER — Ambulatory Visit
Admission: RE | Admit: 2012-02-20 | Discharge: 2012-02-20 | Disposition: A | Discharge status: Home / Self Care | Payer: Medicaid Other | Source: Ambulatory Visit | Attending: Family Medicine | Admitting: Family Medicine

## 2012-02-20 DIAGNOSIS — M25512 Pain in left shoulder: Secondary | ICD-10-CM

## 2012-02-27 ENCOUNTER — Ambulatory Visit (INDEPENDENT_AMBULATORY_CARE_PROVIDER_SITE_OTHER): Payer: Medicaid Other | Admitting: Family Medicine

## 2012-02-27 DIAGNOSIS — M67919 Unspecified disorder of synovium and tendon, unspecified shoulder: Secondary | ICD-10-CM

## 2012-02-27 DIAGNOSIS — M7582 Other shoulder lesions, left shoulder: Secondary | ICD-10-CM

## 2012-02-27 NOTE — Patient Instructions (Signed)
If you're still having pain in one month, give me a call.

## 2012-02-27 NOTE — Progress Notes (Signed)
  Subjective:    Patient ID: Stephanie Best, female    DOB: 03-26-1961, 51 y.o.   MRN: [id number]  HPI She is here for consult concerning recent MRI of her left shoulder which did show evidence of rotator cuff degenerative changes. Review of the record indicates that she did have a previous injection as well as physical therapy both of which did not give relief of her symptoms.   Review of Systems     Objective:   Physical Exam Good motion of the shoulder. No tenderness to palpation. Neer's and Hawkins test is now negative.       Assessment & Plan:   1. Tendinitis of left rotator cuff    I discussed options with her. She was given another injection of Kenalog and Xylocaine. She tolerated this procedure well. Continued difficulty, referral to orthopedics will be made. She is comfortable with this approach.

## 2012-06-06 ENCOUNTER — Other Ambulatory Visit: Payer: Self-pay | Admitting: Family Medicine

## 2012-06-06 IMAGING — CR DG SHOULDER 2+V*L*
3 series · 3 of 3 positions shown · non-contrast
Comparison: None.

CLINICAL DATA: Left shoulder pain when elevated overhead.  No
injury.

LEFT SHOULDER - 2+ VIEW

[view not recorded (1 of 3)]
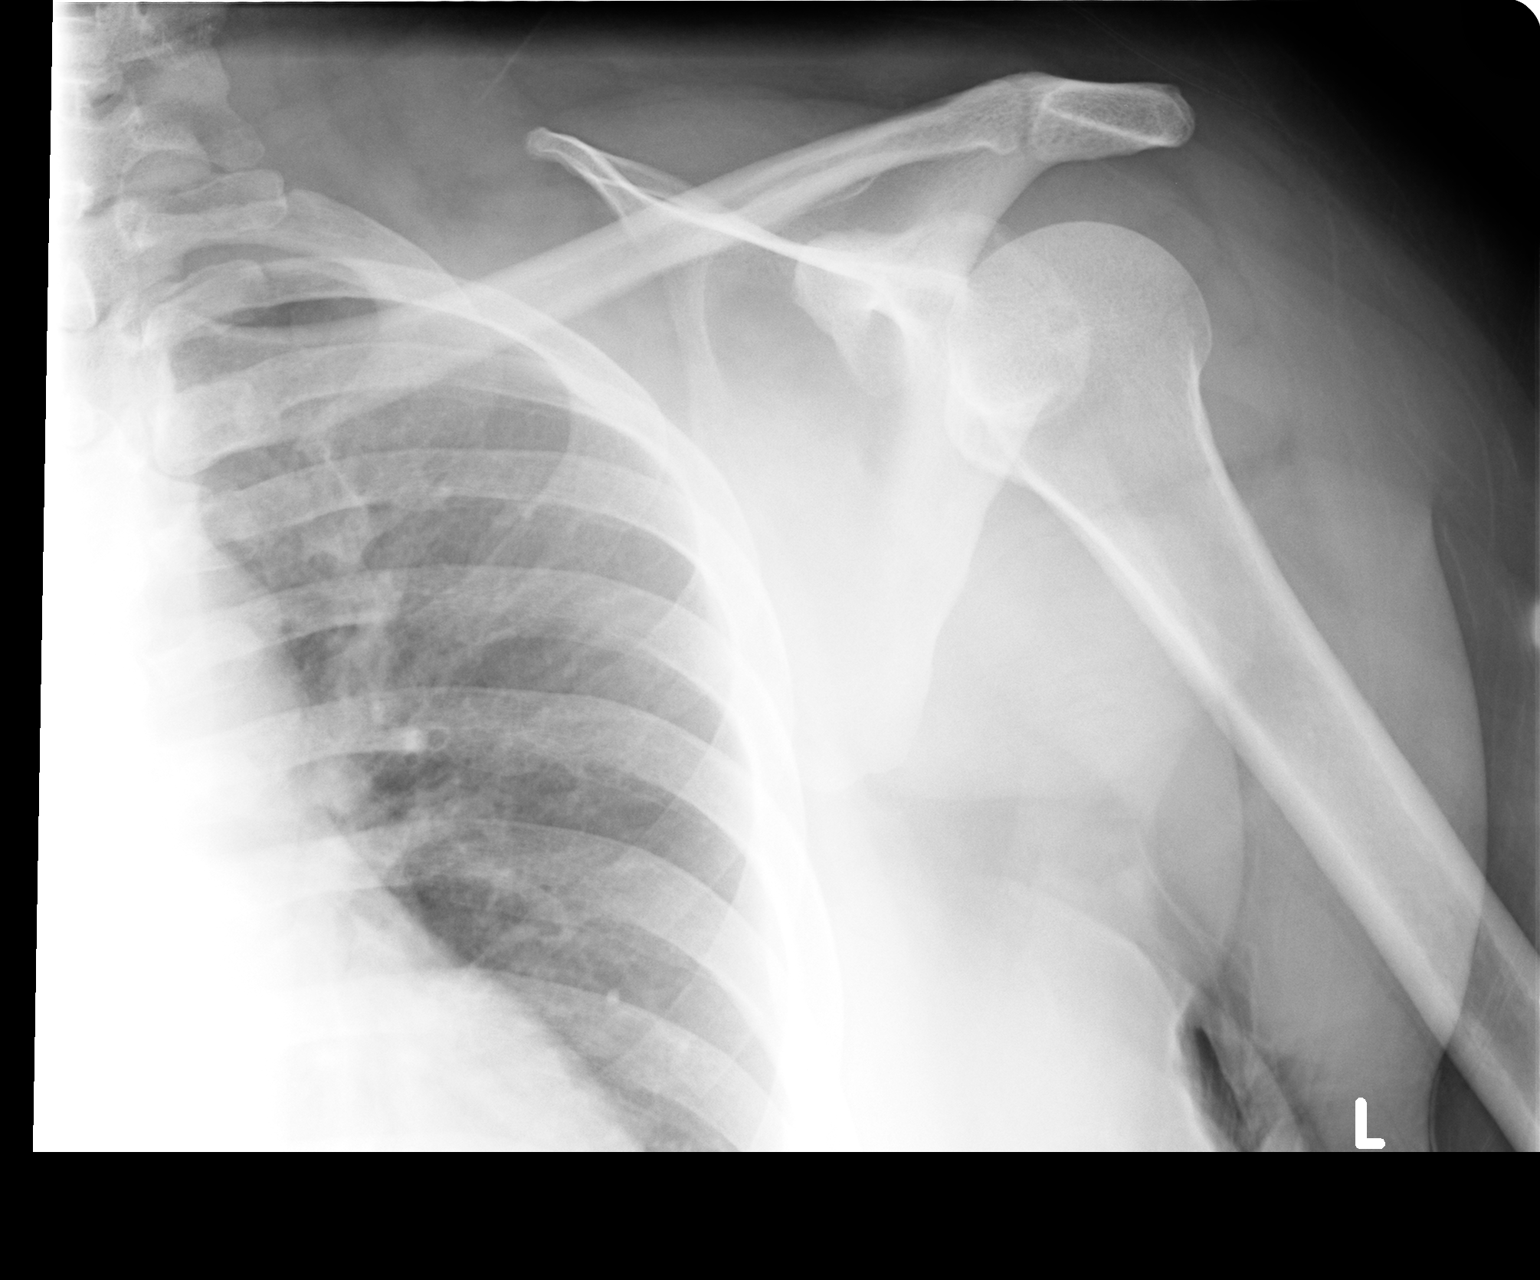

[view not recorded (2 of 3)]
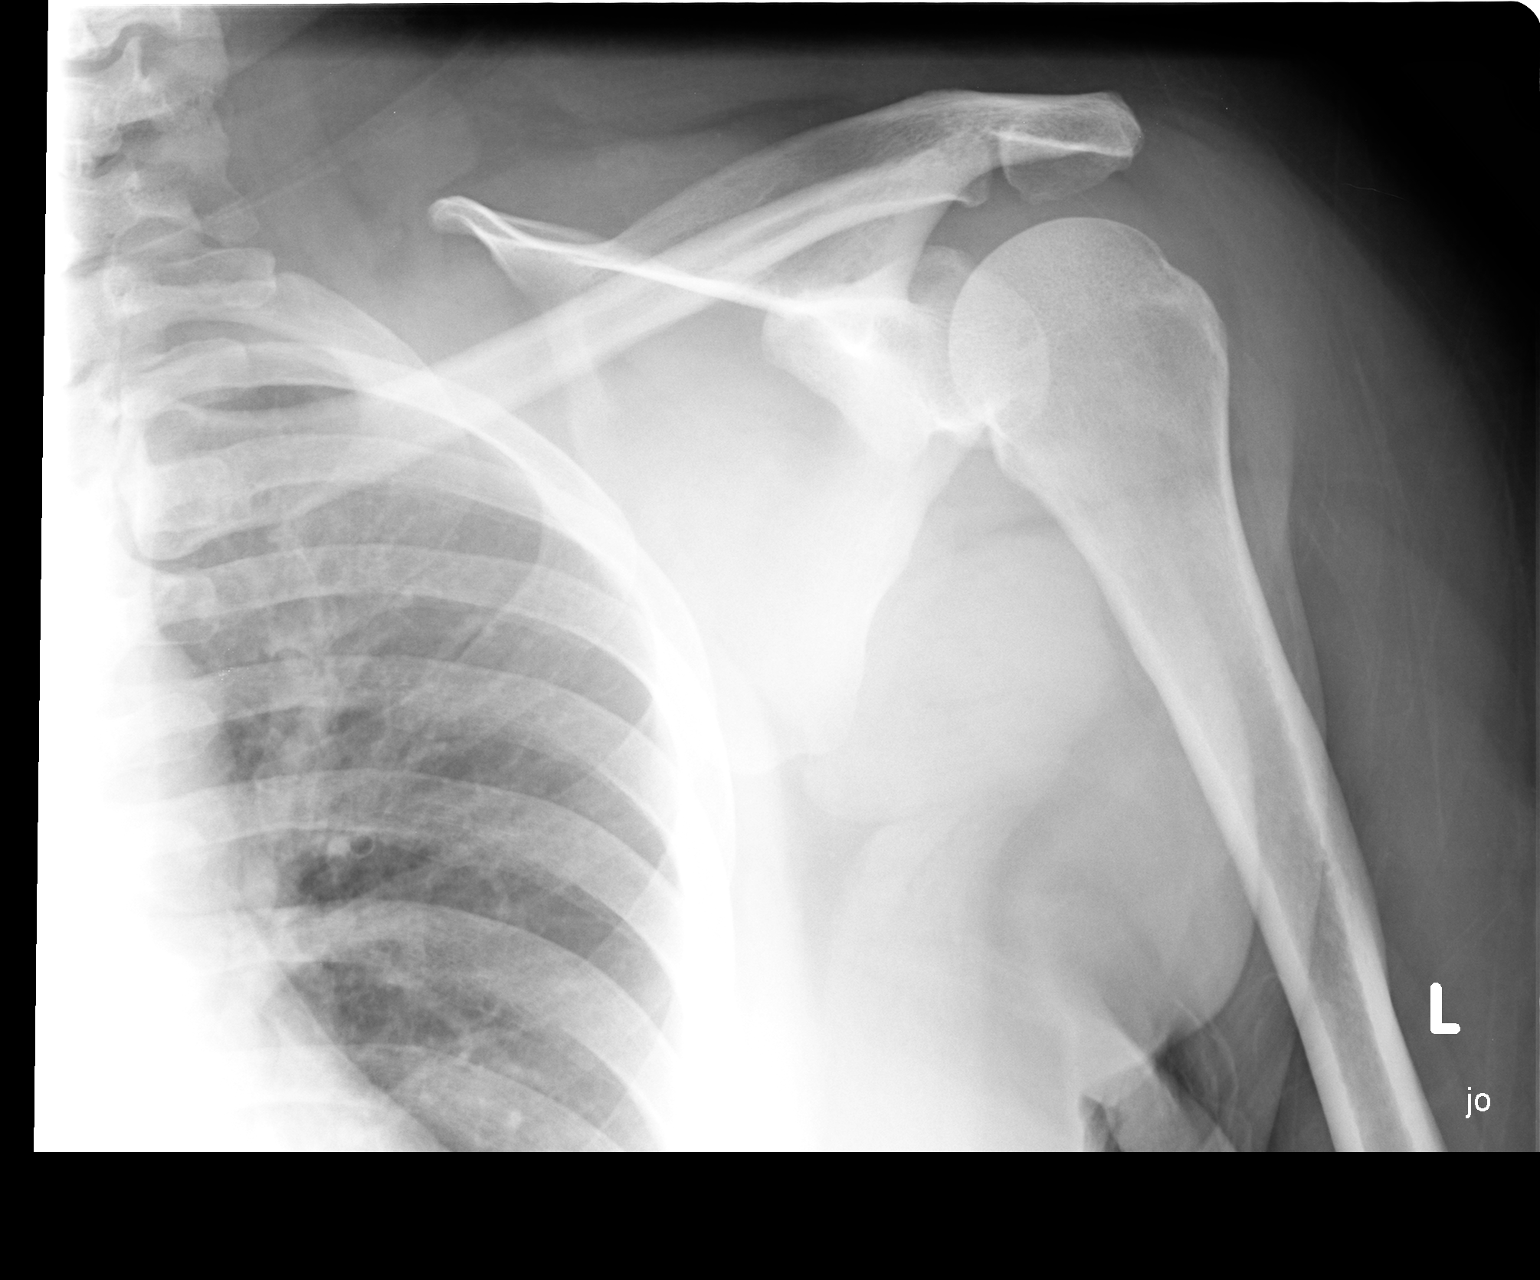

[view not recorded (3 of 3)]
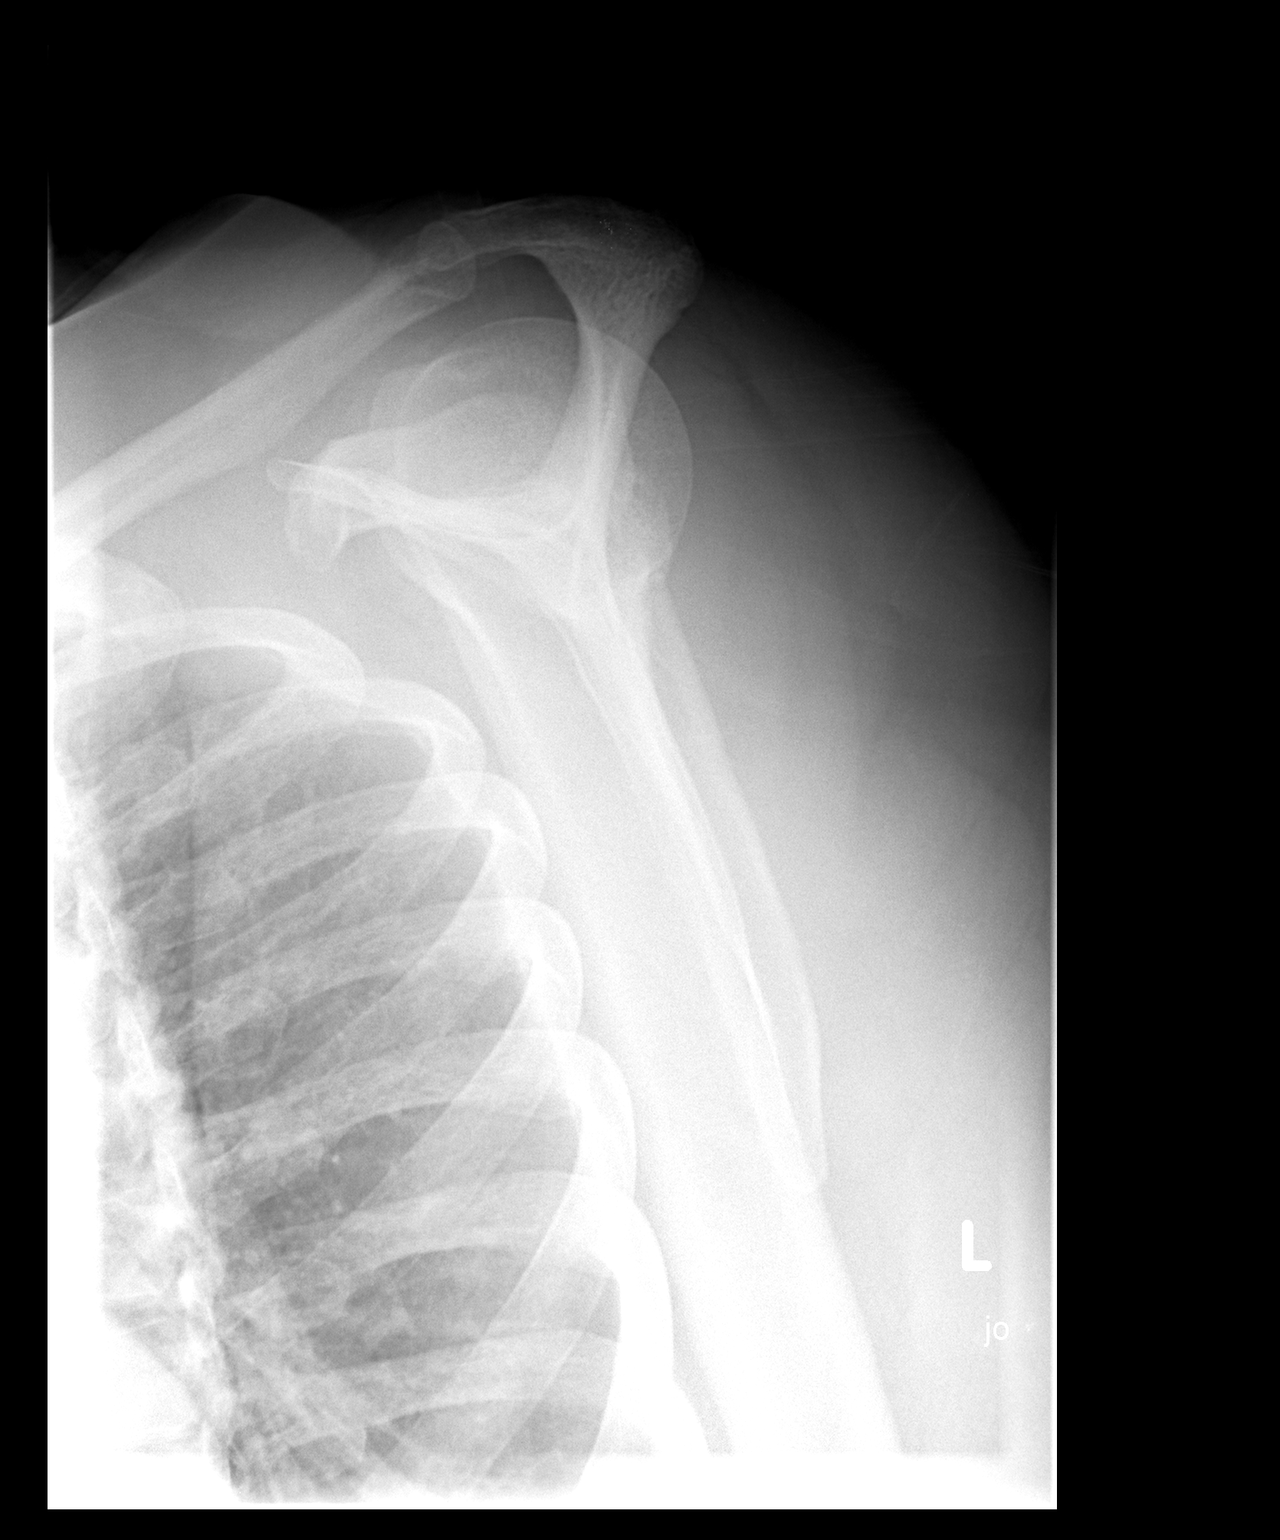

[3 of 3 positions shown; findings below may reference images not displayed]

FINDINGS: Very mild acromioclavicular joint degenerative changes.
No soft tissue calcifications.  Visualized lungs clear.
IMPRESSION: Very mild acromioclavicular joint degenerative changes.

## 2012-06-23 ENCOUNTER — Encounter: Payer: Medicaid Other | Admitting: Family Medicine

## 2012-06-24 ENCOUNTER — Ambulatory Visit (INDEPENDENT_AMBULATORY_CARE_PROVIDER_SITE_OTHER): Payer: Medicaid Other | Admitting: Family Medicine

## 2012-06-24 ENCOUNTER — Encounter: Payer: Self-pay | Admitting: Family Medicine

## 2012-06-24 VITALS — BP 130/82 | HR 82 | Wt 242.0 lb

## 2012-06-24 DIAGNOSIS — I10 Essential (primary) hypertension: Secondary | ICD-10-CM

## 2012-06-24 DIAGNOSIS — E669 Obesity, unspecified: Secondary | ICD-10-CM

## 2012-06-24 DIAGNOSIS — Z79899 Other long term (current) drug therapy: Secondary | ICD-10-CM

## 2012-06-24 LAB — LIPID PANEL
HDL: 52 mg/dL (ref >39)
LDL Cholesterol: 101 mg/dL — ABNORMAL HIGH (ref 0–99)
Triglycerides: 88 mg/dL (ref <150)
VLDL: 18 mg/dL (ref 0–40)

## 2012-06-24 LAB — COMPREHENSIVE METABOLIC PANEL
Alkaline Phosphatase: 63 U/L (ref 39–117)
Glucose, Bld: 101 mg/dL — ABNORMAL HIGH (ref 70–99)
Sodium: 142 mEq/L (ref 135–145)
Total Bilirubin: 0.4 mg/dL (ref 0.3–1.2)
Total Protein: 6.2 g/dL (ref 6.0–8.3)

## 2012-06-24 LAB — CBC WITH DIFFERENTIAL/PLATELET
Basophils Relative: 0 % (ref 0–1)
Eosinophils Absolute: 0.1 10*3/uL (ref 0.0–0.7)
MCH: 28.2 pg (ref 26.0–34.0)
MCHC: 33.9 g/dL (ref 30.0–36.0)
Monocytes Relative: 6 % (ref 3–12)
Neutrophils Relative %: 54 % (ref 43–77)
Platelets: 282 10*3/uL (ref 150–400)
RDW: 13.8 % (ref 11.5–15.5)

## 2012-06-24 MED ORDER — HYDROCHLOROTHIAZIDE 12.5 MG PO CAPS: 12.5000 mg | ORAL_CAPSULE | ORAL | Status: DC

## 2012-06-24 MED ORDER — DILTIAZEM HCL ER BEADS 120 MG PO CP24: 120.0000 mg | ORAL_CAPSULE | Freq: Every day | ORAL | Status: DC

## 2012-06-24 NOTE — Progress Notes (Signed)
  Subjective:    Patient ID: Stephanie Best, female    DOB: [birth year], 51 y.o.   MRN: [id number]  HPI She is here for an interval evaluation. She was recently seen by her gynecologist. Apparently he will be ordering a colonoscopy. She has difficulty with bilateral knee pain which she states interferes with her physical activities. She states she can't afford going to the gym but readily admits that she has not checked on the cost of doing that. She continues on her blood pressure medications and is having no difficulty with this. She is interested in making some changes in her life to try and help with weight.   Review of Systems     Objective:   Physical Exam alert and in no distress. Tympanic membranes and canals are normal. Throat is clear. Tonsils are normal. Neck is supple without adenopathy or thyromegaly. Cardiac exam shows a regular sinus rhythm without murmurs or gallops. Lungs are clear to auscultation.        Assessment & Plan:   1. Hypertension  CBC with Differential, Comprehensive metabolic panel, diltiazem (TAZTIA XT) 120 MG 24 hr capsule, hydrochlorothiazide (MICROZIDE) 12.5 MG capsule  2. Obesity  Amb ref to Medical Nutrition Therapy-MNT, Lipid panel  3. Encounter for long-term (current) use of other medications  CBC with Differential, Comprehensive metabolic panel, Lipid panel   I will refer her to the dietitian. Also discussed making permanent lifestyle changes in regard to her diet and exercise. Discussed checking into the cost of joining the gym. Her medications were renewed.

## 2012-06-24 NOTE — Patient Instructions (Signed)
Followup with the nutritionist. Cut back on carbohydrates. Work towards getting down to a size H. breast size

## 2012-06-26 ENCOUNTER — Telehealth: Payer: Self-pay

## 2012-06-26 ENCOUNTER — Encounter: Payer: Self-pay | Admitting: Internal Medicine

## 2012-06-26 NOTE — Telephone Encounter (Signed)
Patient wants referral to have colonoscopy pt will be 51 soon and and she needs for [place] to make due to she is on medicaid is this ok

## 2012-06-26 NOTE — Telephone Encounter (Signed)
Pt has appt July 31 @ 2pm with nurse pre op Procedure Aug.14 th @ 9 am and arrive at 8 am Pt informed Colonoscopy 

## 2012-07-02 ENCOUNTER — Encounter: Payer: Medicaid Other | Attending: Family Medicine | Admitting: *Deleted

## 2012-07-02 ENCOUNTER — Encounter: Payer: Self-pay | Admitting: *Deleted

## 2012-07-02 VITALS — Ht 64.0 in | Wt 243.2 lb

## 2012-07-02 DIAGNOSIS — Z713 Dietary counseling and surveillance: Secondary | ICD-10-CM | POA: Insufficient documentation

## 2012-07-02 DIAGNOSIS — E669 Obesity, unspecified: Secondary | ICD-10-CM | POA: Insufficient documentation

## 2012-07-02 DIAGNOSIS — I10 Essential (primary) hypertension: Secondary | ICD-10-CM | POA: Insufficient documentation

## 2012-07-02 NOTE — Progress Notes (Signed)
  Medical Nutrition Therapy:  Appt start time: 1645 end time:  1745.   Assessment:  Primary concerns today: obesity and hypertension.   MEDICATIONS: see list   DIETARY INTAKE:  Usual eating pattern includes 1-2 meals and ad lib snacks per day.  Everyday foods include soda and chips, other energy-dense foods.  Avoided foods include none.    24-hr recall:  B ( AM): mostly skips.  May have cereal.   Snk ( AM): none  L ( PM): Today had bojangles- 3 wings, with biscuit, rice and tea  Snk ( PM): chips D ( PM): may skip.  May get fast food Snk ( PM): cookies Beverages: water and soda  Usual physical activity: none  Estimated energy needs: 1200 calories 135 g carbohydrates 90 g protein 33 g fat  Progress Towards Goal(s):  In progress.   Nutritional Diagnosis:  [place]-3.3 Overweight/obesity As related to large portions of energy-dense foods, erratic meal patterns, and limited physical activity.  As evidenced by BMI of 41.7.    Intervention:  Nutrition counseling provided.  Patient is emotional eater, meal skipper, compulsive snacker, eats out frequently, and is unable to afford a gym membership.  She can't walk due to swelling in knee.  She has multiple barriers to a healthy lifestyle.  Provided information on chair exercises, emotional eating, tips to lower blood pressure, and healthier snacks.  She is not ready to make major changes to her food choices so encouraged her to eat half of what she's used to.  Also encouraged 3 meals a day and to avoid meal skipping.  Encouraged her to cut out concentrated sweets.  Handouts given during visit include:  Chair exercises  Emotional eating  Healthy snacks  Monitoring/Evaluation:  Dietary intake, exercise,  and body weight in 1 month(s).  Evaluate readiness for preparing healthier meals at home (give Tips handout)

## 2012-07-02 NOTE — Patient Instructions (Addendum)
Goals:  Eat 3 meals/day, Avoid meal skipping    Aim for >20 min of physical activity daily (chair exercise)  Limit sugar-sweetened beverages and concentrated sweets- limit sodas and sweet tea.  Drink more water (flavor with sugar-free flavoring)  Do not add salt to food, use sodium-free flavorings; choose foods from list that are low in sodium  Look for calorieking.com  Or calorieking app for smartphone  Baked chips instead of regular or Sun chips or pretzles  Skinny Cow frozen treats or sherbert, nilla wafer, animal crackers, graham crackers, sugar-free pudding

## 2012-07-09 ENCOUNTER — Ambulatory Visit (AMBULATORY_SURGERY_CENTER): Payer: Medicaid Other | Admitting: *Deleted

## 2012-07-09 VITALS — Ht 64.0 in | Wt 240.8 lb

## 2012-07-09 DIAGNOSIS — Z1211 Encounter for screening for malignant neoplasm of colon: Secondary | ICD-10-CM

## 2012-07-09 MED ORDER — MOVIPREP 100 G PO SOLR: ORAL | Status: DC

## 2012-07-10 HISTORY — PX: COLONOSCOPY: SHX174

## 2012-07-23 ENCOUNTER — Encounter: Payer: Self-pay | Admitting: Internal Medicine

## 2012-07-23 ENCOUNTER — Ambulatory Visit (AMBULATORY_SURGERY_CENTER): Payer: Medicaid Other | Admitting: Internal Medicine

## 2012-07-23 VITALS — BP 156/77 | HR 96 | Temp 97.0°F | Resp 20 | Ht 64.0 in | Wt 240.0 lb

## 2012-07-23 DIAGNOSIS — Z1211 Encounter for screening for malignant neoplasm of colon: Secondary | ICD-10-CM

## 2012-07-23 DIAGNOSIS — D126 Benign neoplasm of colon, unspecified: Secondary | ICD-10-CM

## 2012-07-23 MED ORDER — SODIUM CHLORIDE 0.9 % IV SOLN: 500.0000 mL | INTRAVENOUS | Status: DC

## 2012-07-23 NOTE — Patient Instructions (Addendum)
YOU HAD AN ENDOSCOPIC PROCEDURE TODAY AT THE [facility]: Refer to the procedure report that was given to you for any specific questions about what was found during the examination.  If the procedure report does not answer your questions, please call your gastroenterologist to clarify.  If you requested that your care partner not be given the details of your procedure findings, then the procedure report has been included in a sealed envelope for you to review at your convenience later.  YOU SHOULD EXPECT: Some feelings of bloating in the abdomen. Passage of more gas than usual.  Walking can help get rid of the air that was put into your GI tract during the procedure and reduce the bloating. If you had a lower endoscopy (such as a colonoscopy or flexible sigmoidoscopy) you may notice spotting of blood in your stool or on the toilet paper. If you underwent a bowel prep for your procedure, then you may not have a normal bowel movement for a few days.  DIET: Your first meal following the procedure should be a light meal and then it is ok to progress to your normal diet.  A half-sandwich or bowl of soup is an example of a good first meal.  Heavy or fried foods are harder to digest and may make you feel nauseous or bloated.  Likewise meals heavy in dairy and vegetables can cause extra gas to form and this can also increase the bloating.  Drink plenty of fluids but you should avoid alcoholic beverages for 24 hours.  ACTIVITY: Your care partner should take you home directly after the procedure.  You should plan to take it easy, moving slowly for the rest of the day.  You can resume normal activity the day after the procedure however you should NOT DRIVE or use heavy machinery for 24 hours (because of the sedation medicines used during the test).    SYMPTOMS TO REPORT IMMEDIATELY: A gastroenterologist can be reached at any hour.  During normal business hours, 8:30 AM to 5:00 PM Monday through Friday,  call [phone].  After hours and on weekends, please call the GI answering service at [phone] who will take a message and have the physician on call contact you.   Following lower endoscopy (colonoscopy or flexible sigmoidoscopy):  Excessive amounts of blood in the stool  Significant tenderness or worsening of abdominal pains  Swelling of the abdomen that is new, acute  Fever of 100F or higher    FOLLOW UP: If any biopsies were taken you will be contacted by phone or by letter within the next 1-3 weeks.  Call your gastroenterologist if you have not heard about the biopsies in 3 weeks.  Our staff will call the home number listed on your records the next business day following your procedure to check on you and address any questions or concerns that you may have at that time regarding the information given to you following your procedure. This is a courtesy call and so if there is no answer at the home number and we have not heard from you through the emergency physician on call, we will assume that you have returned to your regular daily activities without incident.  SIGNATURES/CONFIDENTIALITY: You and/or your care partner have signed paperwork which will be entered into your electronic medical record.  These signatures attest to the fact that that the information above on your After Visit Summary has been reviewed and is understood.  Full responsibility of the confidentiality   of this discharge information lies with you and/or your care-partner.     INFORMATION ON POLYPS, DIVERTICULOSIS, & HIGH FIBER DIET GIVEN TO YOU TODAY 

## 2012-07-23 NOTE — Progress Notes (Signed)
Patient did not experience any of the following events: a burn prior to discharge; a fall within the facility; wrong site/side/patient/procedure/implant event; or a hospital transfer or hospital admission upon discharge from the facility. (G8907) Patient did not have preoperative order for IV antibiotic SSI prophylaxis. (G8918)  

## 2012-07-23 NOTE — Op Note (Signed)
[facility] [address] [place], [place]  [address]  COLONOSCOPY PROCEDURE REPORT  PATIENT:  Stephanie, Best  MR#:  [id number] BIRTHDATE:  1961/02/26, 51 yrs. old  GENDER:  female ENDOSCOPIST:  Wilhemina Bonito. Eda Keys, MD REF. BY:  Sharlot Gowda, M.D. PROCEDURE DATE:  07/23/2012 PROCEDURE:  Colonoscopy with snare polypectomy x 1 ASA CLASS:  Class II INDICATIONS:  Routine Risk Screening MEDICATIONS:   MAC sedation, administered by CRNA, propofol (Diprivan) 400 mg IV  DESCRIPTION OF PROCEDURE:   After the risks benefits and alternatives of the procedure were thoroughly explained, informed consent was obtained.  Digital rectal exam was performed and revealed no abnormalities.   The LB CF-H180AL [id number] endoscope was introduced through the anus and advanced to the cecum, which was identified by both the appendix and ileocecal valve, without limitations.  The quality of the prep was excellent, using MoviPrep.  The instrument was then slowly withdrawn as the colon was fully examined. <<PROCEDUREIMAGES>>  FINDINGS:  A 5mm polyp was found in the transverse colon and snared without cautery. Retrieval was successful.   Mild diverticulosis was found in the sigmoid colon.  Otherwise normal colonoscopy without other polyps, masses, vascular ectasias, or inflammatory changes.   Retroflexed views in the rectum revealed no abnormalities.    The time to cecum =  2:24  minutes. The scope was then withdrawn in  10:41  minutes from the cecum and the procedure completed.  COMPLICATIONS:  None  ENDOSCOPIC IMPRESSION: 1) Diminutive polyp in the transverse colon - removed 2) Mild diverticulosis in the sigmoid colon 3) Otherwise normal colonoscopy  RECOMMENDATIONS: 1) Repeat colonoscopy in 5 years if polyp adenomatous; otherwise 10 years  ______________________________ Wilhemina Bonito. Eda Keys, MD  CC:  Sharlot Gowda, MD;  The Patient  n. eSIGNED:   Wilhemina Bonito. Eda Keys at 07/23/2012 09:48  AM  Will Bonnet, [phone]

## 2012-07-24 ENCOUNTER — Telehealth: Payer: Self-pay

## 2012-07-24 NOTE — Telephone Encounter (Signed)
  Follow up Call-  Call back number 07/23/2012  Post procedure Call Back phone  # [phone]  Permission to leave phone message Yes     Patient questions:  Do you have a fever, pain , or abdominal swelling? no Pain Score  0 *  Have you tolerated food without any problems? yes  Have you been able to return to your normal activities? yes  Do you have any questions about your discharge instructions: Diet   no Medications  no Follow up visit  no  Do you have questions or concerns about your Care? no  Actions: * If pain score is 4 or above: No action needed, pain <4.  Per the pt " my chin is a little sore this am".  I advised the pt we should not have caused any chin discomfort, but if it does not resolve to call us back. Maw

## 2012-07-29 ENCOUNTER — Encounter: Payer: Self-pay | Admitting: Internal Medicine

## 2012-08-04 ENCOUNTER — Encounter: Payer: Medicaid Other | Attending: Family Medicine | Admitting: *Deleted

## 2012-08-04 VITALS — Wt 241.6 lb

## 2012-08-04 DIAGNOSIS — Z713 Dietary counseling and surveillance: Secondary | ICD-10-CM | POA: Insufficient documentation

## 2012-08-04 DIAGNOSIS — E669 Obesity, unspecified: Secondary | ICD-10-CM | POA: Insufficient documentation

## 2012-08-04 DIAGNOSIS — I10 Essential (primary) hypertension: Secondary | ICD-10-CM | POA: Insufficient documentation

## 2012-08-04 NOTE — Patient Instructions (Addendum)
Try to limit fats and sugar Increase physical activity (do chair exercise) Aim for 3 meals/day Choose lower fat, lower salt snack

## 2012-08-04 NOTE — Progress Notes (Signed)
  Medical Nutrition Therapy:  Appt start time: 1515 end time:  1545.   Assessment:  Primary concerns today: obesity, HTN follow up.   MEDICATIONS: see list   DIETARY INTAKE:  Usual eating pattern includes 2 meals and 2-3 snacks per day.  Everyday foods include starches, fruit, protein.  Avoided foods include sodas.    24-hr recall:  B ( AM): frosted flakes  Snk ( AM): fruit  L ( PM): skips Snk ( PM): fruit or chips D ( PM): ribs or grilled chicken salad with mac-n-cheese, collards with squash Snk ( PM): popsicle Beverages: water with crystal light- tea on rare occasions . No soda Usual physical activity: none  Estimated energy needs: 1400 calories 158 g carbohydrates 105 g protein 39 g fat  Progress Towards Goal(s):  Some progress.   Nutritional Diagnosis:  [place]-3.3 Overweight/obesity As related to large portions of energy-dense foods, erratic meal patterns, and limited physical activity. As evidenced by BMI of 41.5.     Intervention:  Nutrition counseling provided.  Stephanie Best has cut back on sodas and snacks more on fruits than before.  She's lost about 2 lb since last month.  Applauded her for her efforts.  She is disappointed and wanted to loose more, however, she's not been exercising and is reluctant to give up some of her favorite fattening snacks (chips and popcorn).  She snacks throughout the day, rather than eating a lunch meal.  Discussed possible ways to further reduce fat (switch to 2% milk, choose non-sugary cereal, choose healthier snacks, etc).  Encouraged her to take weight loss at her pace; however fast she wants to loose, she needs to make that many more changes.  If she wants to loose weight slowly, she doesn't need to make as many changes.  Emphasized need for physical activity  Monitoring/Evaluation:  Dietary intake, exercise, and body weight in 1 month(s).

## 2012-09-09 ENCOUNTER — Ambulatory Visit: Payer: Medicaid Other | Admitting: *Deleted

## 2012-12-29 ENCOUNTER — Ambulatory Visit (INDEPENDENT_AMBULATORY_CARE_PROVIDER_SITE_OTHER): Payer: Medicaid Other | Admitting: Medical

## 2012-12-29 ENCOUNTER — Encounter: Payer: Self-pay | Admitting: Medical

## 2012-12-29 VITALS — BP 142/90 | HR 72 | Temp 97.8°F | Resp 16 | Wt 238.0 lb

## 2012-12-29 DIAGNOSIS — R05 Cough: Secondary | ICD-10-CM

## 2012-12-29 DIAGNOSIS — H6690 Otitis media, unspecified, unspecified ear: Secondary | ICD-10-CM

## 2012-12-29 DIAGNOSIS — J40 Bronchitis, not specified as acute or chronic: Secondary | ICD-10-CM

## 2012-12-29 DIAGNOSIS — R059 Cough, unspecified: Secondary | ICD-10-CM

## 2012-12-29 MED ORDER — HYDROCODONE-HOMATROPINE 5-1.5 MG/5ML PO SYRP: 5.0000 mL | ORAL_SOLUTION | Freq: Three times a day (TID) | ORAL | Status: DC | PRN

## 2012-12-29 MED ORDER — CLARITHROMYCIN ER 500 MG PO TB24: 1000.0000 mg | ORAL_TABLET | Freq: Every day | ORAL | Status: DC

## 2012-12-29 NOTE — Progress Notes (Signed)
Subjective: Here for c/o illness.  She reports 5 days hx/o low grade fever, chest congestion, cough, head pressure, bad cough worsening, stomach sore from coughing.  Productive sputum, throat hurts, ears feel clogged up, has some shortness of breath.   Using Coricidin HBP.   No sick contacts.  Nonsmoker.  Not having to use albuterol.  Has hx/o asthma, but no recent flare.  No other aggravating or relieving factors.  No other c/o.  The following portions of the patient's history were reviewed and updated as appropriate: allergies, current medications, past family history, past medical history, past social history, past surgical history and problem list.  Past Medical History  Diagnosis Date  . Asthma   . Hypertension   . Obesity    ROS as in subjective  Objective: Vital signs reviewed  General appearance: Alert, WD/WN, no distress                             Skin: warm, no rash, no diaphoresis                           Head: maxillary sinus tenderness                            Eyes: conjunctiva normal, corneas clear, PERRLA                            Ears: mild erythema of bilat TMs, serous fluid behind TMs, external ear canals normal                          Nose: septum midline, turbinates swollen, with erythema and clear discharge             Mouth/throat: MMM, tongue normal, mild pharyngeal erythema                           Neck: supple, no adenopathy, no thyromegaly, nontender                          Heart: RRR, normal S1, S2, no murmurs                         Lungs: +bronchial breath sounds, +scattered rhonchi, no wheezes, no rales                Extremities: no edema, nontender     Assessment and Plan: Encounter Diagnoses  Name Primary?  . Otitis media Yes  . Cough   . Bronchitis    Prescription given today for Biaxin antibiotic and Hycodan syrup as below.  Discussed diagnosis and treatment of bronchitis.  Suggested symptomatic OTC remedies for cough and congestion.   Tylenol or Ibuprofen OTC for fever and malaise.  Call/return in 2-3 days if symptoms are worse or not improving.

## 2012-12-30 ENCOUNTER — Other Ambulatory Visit: Payer: Self-pay | Admitting: Medical

## 2012-12-30 ENCOUNTER — Telehealth: Payer: Self-pay | Admitting: Medical

## 2012-12-30 MED ORDER — FLUCONAZOLE 150 MG PO TABS: 150.0000 mg | ORAL_TABLET | Freq: Once | ORAL | Status: DC

## 2012-12-30 MED ORDER — CLARITHROMYCIN 500 MG PO TABS: 500.0000 mg | ORAL_TABLET | Freq: Two times a day (BID) | ORAL | Status: DC

## 2012-12-30 NOTE — Telephone Encounter (Signed)
DID YOU SEE THE NOTE ABOUT THE DIFLUCAN?

## 2012-12-30 NOTE — Telephone Encounter (Signed)
BIAXIN 500mg  is not covered by medicaid $90.88 re-send alternate to [facility]

## 2012-12-30 NOTE — Telephone Encounter (Signed)
I left a message on the patients voicemail in reference to her medication. CLS

## 2012-12-30 NOTE — Telephone Encounter (Signed)
i sent different form of this.  Sorry for the issue.

## 2012-12-30 NOTE — Telephone Encounter (Signed)
i sent the diflucan

## 2013-04-30 ENCOUNTER — Telehealth: Payer: Self-pay | Admitting: Family Medicine

## 2013-05-05 ENCOUNTER — Telehealth: Payer: Self-pay | Admitting: Internal Medicine

## 2013-05-05 NOTE — Telephone Encounter (Signed)
Give her whatever generic her insurance will cover

## 2013-05-05 NOTE — Telephone Encounter (Signed)
THIS IS FOR A PRIOR AUTH

## 2013-05-05 NOTE — Telephone Encounter (Signed)
Pharmacy states mediciad will now pay for brand cardizem [place], may we dispense 120mg ? Pt is currently on diltiazem 120mg  24hr. Send to [facility] [place]

## 2013-05-13 NOTE — Telephone Encounter (Signed)
RX MUST ME BRAND NAME ONLY

## 2013-06-02 ENCOUNTER — Telehealth: Payer: Self-pay | Admitting: Family Medicine

## 2013-06-02 NOTE — Telephone Encounter (Signed)
LM

## 2013-07-13 ENCOUNTER — Other Ambulatory Visit: Payer: Self-pay | Admitting: Family Medicine

## 2013-09-10 ENCOUNTER — Telehealth: Payer: Self-pay | Admitting: Family Medicine

## 2013-09-10 MED ORDER — DILTIAZEM HCL ER BEADS 120 MG PO CP24: ORAL_CAPSULE | ORAL | Status: DC

## 2013-09-10 MED ORDER — HYDROCHLOROTHIAZIDE 12.5 MG PO CAPS: ORAL_CAPSULE | ORAL | Status: DC

## 2013-09-10 NOTE — Telephone Encounter (Signed)
SENT MED IN 

## 2013-09-10 NOTE — Telephone Encounter (Signed)
Pt is requesting refills for Taztia XT 120mg / 24 hr cap #30 and Hydrochlorothizide 12.5 mg # 30 to [facility] off [address]

## 2013-11-27 ENCOUNTER — Ambulatory Visit: Payer: Self-pay | Admitting: Family Medicine

## 2014-01-08 ENCOUNTER — Ambulatory Visit: Payer: Self-pay | Admitting: Family Medicine

## 2014-02-15 ENCOUNTER — Other Ambulatory Visit: Payer: Self-pay | Admitting: Medical

## 2014-03-09 ENCOUNTER — Encounter: Payer: Self-pay | Admitting: Family Medicine

## 2014-03-09 ENCOUNTER — Ambulatory Visit (INDEPENDENT_AMBULATORY_CARE_PROVIDER_SITE_OTHER): Payer: Medicaid Other | Admitting: Family Medicine

## 2014-03-09 VITALS — BP 150/100 | HR 86 | Ht 64.0 in | Wt 227.0 lb

## 2014-03-09 DIAGNOSIS — I10 Essential (primary) hypertension: Secondary | ICD-10-CM

## 2014-03-09 DIAGNOSIS — L259 Unspecified contact dermatitis, unspecified cause: Secondary | ICD-10-CM

## 2014-03-09 DIAGNOSIS — E669 Obesity, unspecified: Secondary | ICD-10-CM

## 2014-03-09 DIAGNOSIS — L309 Dermatitis, unspecified: Secondary | ICD-10-CM

## 2014-03-09 MED ORDER — LISINOPRIL-HYDROCHLOROTHIAZIDE 20-12.5 MG PO TABS: 1.0000 | ORAL_TABLET | Freq: Every day | ORAL | Status: DC

## 2014-03-09 NOTE — Progress Notes (Signed)
   Subjective:    Patient ID: Stephanie Best, female    DOB: Jan 18, 1961, 53 y.o.   MRN: [id number]  HPI She has a one-month history of a rash on her lower extremities.Marland Kitchen this started with one raised red patch on her right calf. She noted spread of this to multiple red patches.   Review of Systems     Objective:   Physical Exam No distress. Blood pressure is recorded. Multiple erythematous flat irregular shape slightly visible lesions are noted on both lower extremities but none on the abdomen       Assessment & Plan:  Dermatitis - Plan: Ambulatory referral to Dermatology  Hypertension - Plan: lisinopril-hydrochlorothiazide (ZESTORETIC) 20-12.5 MG per tablet  Obesity  etiology of lesions on her legs is unclear and therefore we will refer. I will also switch her to a different antihypertensive regimen since the other one didn't seem to control her. Discussed possible side effects from the lisinopril. Recheck here in one month.

## 2014-03-10 DIAGNOSIS — R42 Dizziness and giddiness: Secondary | ICD-10-CM

## 2014-03-10 HISTORY — DX: Dizziness and giddiness: R42

## 2014-03-16 ENCOUNTER — Encounter (HOSPITAL_COMMUNITY): Payer: Self-pay | Admitting: Emergency Medicine

## 2014-03-16 ENCOUNTER — Other Ambulatory Visit: Payer: Self-pay

## 2014-03-16 ENCOUNTER — Emergency Department (HOSPITAL_COMMUNITY): Payer: Medicaid Other

## 2014-03-16 ENCOUNTER — Emergency Department (HOSPITAL_COMMUNITY)
Admission: EM | Admit: 2014-03-16 | Discharge: 2014-03-16 | Disposition: A | Discharge status: Home / Self Care | Payer: Medicaid Other | Attending: Emergency Medicine | Admitting: Emergency Medicine

## 2014-03-16 DIAGNOSIS — R110 Nausea: Secondary | ICD-10-CM | POA: Insufficient documentation

## 2014-03-16 DIAGNOSIS — I10 Essential (primary) hypertension: Secondary | ICD-10-CM | POA: Insufficient documentation

## 2014-03-16 DIAGNOSIS — R42 Dizziness and giddiness: Secondary | ICD-10-CM | POA: Insufficient documentation

## 2014-03-16 DIAGNOSIS — E669 Obesity, unspecified: Secondary | ICD-10-CM | POA: Insufficient documentation

## 2014-03-16 DIAGNOSIS — J45909 Unspecified asthma, uncomplicated: Secondary | ICD-10-CM | POA: Insufficient documentation

## 2014-03-16 DIAGNOSIS — Z79899 Other long term (current) drug therapy: Secondary | ICD-10-CM | POA: Insufficient documentation

## 2014-03-16 LAB — CBC
HEMATOCRIT: 41.8 % (ref 36.0–46.0)
HEMOGLOBIN: 14.5 g/dL (ref 12.0–15.0)
MCH: 28.9 pg (ref 26.0–34.0)
MCHC: 34.7 g/dL (ref 30.0–36.0)
MCV: 83.3 fL (ref 78.0–100.0)
Platelets: 279 10*3/uL (ref 150–400)
RBC: 5.02 MIL/uL (ref 3.87–5.11)
RDW: 13.9 % (ref 11.5–15.5)
WBC: 9.2 10*3/uL (ref 4.0–10.5)

## 2014-03-16 LAB — BASIC METABOLIC PANEL
BUN: 14 mg/dL (ref 6–23)
CALCIUM: 9.4 mg/dL (ref 8.4–10.5)
CO2: 26 mEq/L (ref 19–32)
Chloride: 104 mEq/L (ref 96–112)
Creatinine, Ser: 0.77 mg/dL (ref 0.50–1.10)
GFR calc Af Amer: >90 mL/min (ref >90)
GFR calc non Af Amer: >90 mL/min (ref >90)
GLUCOSE: 98 mg/dL (ref 70–99)
POTASSIUM: 3.8 meq/L (ref 3.7–5.3)
Sodium: 144 mEq/L (ref 137–147)

## 2014-03-16 MED ORDER — MECLIZINE HCL 25 MG PO TABS: 25.0000 mg | ORAL_TABLET | Freq: Three times a day (TID) | ORAL | Status: DC | PRN

## 2014-03-16 MED ORDER — MECLIZINE HCL 25 MG PO TABS
25.0000 mg | ORAL_TABLET | Freq: Once | ORAL | Status: AC
  Administered 2014-03-16: 25 mg via ORAL
  Filled 2014-03-16: qty 1

## 2014-03-16 NOTE — ED Provider Notes (Signed)
I saw and evaluated the patient, reviewed the resident's note and I agree with the findings and plan.   EKG Interpretation None      Date: 03/16/2014  Rate: 60s  Rhythm: normal sinus rhythm  QRS Axis: normal  Intervals: normal  ST/T Wave abnormalities: ST elevation in V1, V2 c/w early repolarization  Conduction Disutrbances:none  Narrative Interpretation:   Old EKG Reviewed: none available    Patient here with dizziness - happened in court - described as spinning. No CP, SOB, no vomiting or nausea. Sensation gone here. TMs clear, normal neuro exam. Likely BPPV. EKG with early repolarization, no concern for STEMI - reviewed by Cardiologist, Dr. Acie Fredrickson also.   Osvaldo Shipper, MD 03/16/14 2351

## 2014-03-16 NOTE — Discharge Instructions (Signed)

## 2014-03-16 NOTE — ED Notes (Signed)
Pt reports sudden onset dizziness while sitting down with double vision starting at 2pm today. Pt also having nausea. Pt denies sensation changes, weakness. Reports some improvement. Stroke scale negative.

## 2014-03-16 NOTE — ED Provider Notes (Signed)
CSN: [id number]     Arrival date & time 03/16/14  1514 History   First MD Initiated Contact with Patient 03/16/14 1643     Chief Complaint  Patient presents with  . Dizziness     (Consider location/radiation/quality/duration/timing/severity/associated sxs/prior Treatment) Patient is a 53 y.o. female presenting with dizziness. The history is provided by the patient.  Dizziness Quality:  Lightheadedness and vertigo Severity:  Mild Onset quality:  Gradual Duration:  1 day Timing:  Intermittent Progression:  Resolved Chronicity:  New Context comment:  Pt states she was in court because her sonw as murdered and today the killer was sentenced and she had some dizziness, but it is now resolved Relieved by:  Nothing Worsened by:  Nothing tried Ineffective treatments:  None tried Associated symptoms: nausea   Associated symptoms: no chest pain, no diarrhea, no headaches, no palpitations, no shortness of breath, no syncope, no vomiting and no weakness   Risk factors: hx of vertigo (has had it once before some years ago but is not treated for it)   Risk factors: no new medications     Past Medical History  Diagnosis Date  . Asthma   . Hypertension   . Obesity    Past Surgical History  Procedure Laterality Date  . Breast reduction surgery  1998  . Knee arthroscopy  1995,06/2008    LEFT,RIGHT  . Abdominal hysterectomy    . Shoulder surgery      tumor removal   Family History  Problem Relation Age of Onset  . Hypertension Mother   . Cancer Father 88    Stomach cancer  . Stomach cancer Father 91  . Colon cancer Neg Hx   . Colon polyps Neg Hx   . Rectal cancer Neg Hx    History  Substance Use Topics  . Smoking status: Never Smoker   . Smokeless tobacco: Never Used  . Alcohol Use: 0.6 oz/week    1 Glasses of wine per week   OB History   Grav Para Term Preterm Abortions TAB SAB Ect Mult Living                 Review of Systems  Constitutional: Negative for fever,  activity change, appetite change and fatigue.  HENT: Negative for congestion and rhinorrhea.   Eyes: Negative for discharge, redness, itching and visual disturbance.  Respiratory: Negative for shortness of breath and wheezing.   Cardiovascular: Negative for chest pain, palpitations and syncope.  Gastrointestinal: Positive for nausea. Negative for vomiting, abdominal pain, diarrhea and constipation.  Genitourinary: Negative for dysuria, hematuria and difficulty urinating.  Musculoskeletal: Negative for gait problem.  Skin: Negative for rash and wound.  Neurological: Positive for dizziness. Negative for seizures, syncope, weakness, numbness and headaches.      Allergies  Review of patient's allergies indicates no known allergies.  Home Medications   Current Outpatient Rx  Name  Route  Sig  Dispense  Refill  . lisinopril-hydrochlorothiazide (ZESTORETIC) 20-12.5 MG per tablet   Oral   Take 1 tablet by mouth daily.   30 tablet   3   . meclizine (ANTIVERT) 25 MG tablet   Oral   Take 1 tablet (25 mg total) by mouth 3 (three) times daily as needed for dizziness.   30 tablet   0    BP 138/73  Pulse 76  Temp(Src) 98 F (36.7 C) (Oral)  Resp 18  SpO2 99% Physical Exam  Constitutional: She is oriented to person, place, and time. She  appears well-developed and well-nourished. No distress.  HENT:  Head: Normocephalic and atraumatic.  Mouth/Throat: Oropharynx is clear and moist.  Eyes: Conjunctivae and EOM are normal. Pupils are equal, round, and reactive to light. Right eye exhibits no discharge. Left eye exhibits no discharge. No scleral icterus.  Neck: Normal range of motion. Neck supple.  Cardiovascular: Normal rate, regular rhythm and intact distal pulses.  Exam reveals no gallop and no friction rub.   No murmur heard. Pulmonary/Chest: Effort normal and breath sounds normal. No respiratory distress. She has no wheezes. She has no rales.  Abdominal: Soft. She exhibits no  distension and no mass. There is no tenderness.  Musculoskeletal: Normal range of motion.  Neurological: She is alert and oriented to person, place, and time. No cranial nerve deficit. She exhibits normal muscle tone. Coordination normal.  5/5 strength in all exts, normal sensation in all exts, 2+ DTRs in patella and brachioradilias, CNs II-XII intact, F2N negative, ambulatory without ataxia  Skin: She is not diaphoretic.    ED Course  Procedures (including critical care time) Labs Review Labs Reviewed  CBC  BASIC METABOLIC PANEL   Imaging Review Ct Head Wo Contrast  03/16/2014   CLINICAL DATA:  Dizziness, double vision  EXAM: CT HEAD WITHOUT CONTRAST  TECHNIQUE: Contiguous axial images were obtained from the base of the skull through the vertex without intravenous contrast.  COMPARISON:  None.  FINDINGS: No skull fracture is noted. Paranasal sinuses and mastoid air cells are unremarkable.  No intracranial hemorrhage, mass effect or midline shift.  No acute infarction. No hydrocephalus. No mass lesion is noted on this unenhanced scan. The gray and white-matter differentiation is preserved.  IMPRESSION: No acute intracranial abnormality.   Electronically Signed   By: Lahoma Crocker M.D.   On: 03/16/2014 17:00     EKG Interpretation None      MDM   MDM: 53 y.o. AAF w/ PMHx of HTN w/ cc: of vertigo. States today was in court and had it. Now resolved without intervention. Had some nausea as well. No sxs now. Denies f/c, chest ain, SOB, abd pain, v/d, numbness, weaknss, HA. Similar sxs some years ago, not treated. Pt states she was very emotional in court today as her son's murderer was being sentenced and feels that may have caused it. Currently in room, very pleasant, joking with me, watching TV. Normal neuro exam. No sxs. Likely reaction to stressful situation, unlikely central process. No nystagmus, head tilt exam does not lateralize. Will give antivert and rx and instruct that if it becomes  continuous vertigo to see doctor. Discharged. Care of case d/w my attending.  Final diagnoses:  Vertigo    Discharged  Sol Passer, MD 03/16/14 [phone]

## 2014-04-09 ENCOUNTER — Encounter: Payer: Self-pay | Admitting: Family Medicine

## 2014-04-09 ENCOUNTER — Ambulatory Visit (INDEPENDENT_AMBULATORY_CARE_PROVIDER_SITE_OTHER): Payer: Medicaid Other | Admitting: Family Medicine

## 2014-04-09 VITALS — BP 150/88 | HR 64 | Temp 97.0°F | Resp 16 | Ht 64.0 in | Wt 234.0 lb

## 2014-04-09 DIAGNOSIS — Z860100 Personal history of colon polyps, unspecified: Secondary | ICD-10-CM | POA: Insufficient documentation

## 2014-04-09 DIAGNOSIS — Z0000 Encounter for general adult medical examination without abnormal findings: Secondary | ICD-10-CM

## 2014-04-09 DIAGNOSIS — I10 Essential (primary) hypertension: Secondary | ICD-10-CM | POA: Diagnosis not present

## 2014-04-09 DIAGNOSIS — E669 Obesity, unspecified: Secondary | ICD-10-CM | POA: Diagnosis not present

## 2014-04-09 DIAGNOSIS — Z23 Encounter for immunization: Secondary | ICD-10-CM

## 2014-04-09 DIAGNOSIS — J45909 Unspecified asthma, uncomplicated: Secondary | ICD-10-CM

## 2014-04-09 LAB — LIPID PANEL
CHOL/HDL RATIO: 2.9 ratio
Cholesterol: 180 mg/dL (ref 0–200)
HDL: 62 mg/dL (ref >39)
LDL CALC: 94 mg/dL (ref 0–99)
TRIGLYCERIDES: 119 mg/dL (ref <150)
VLDL: 24 mg/dL (ref 0–40)

## 2014-04-09 LAB — COMPREHENSIVE METABOLIC PANEL
ALBUMIN: 4.1 g/dL (ref 3.5–5.2)
ALK PHOS: 83 U/L (ref 39–117)
ALT: 17 U/L (ref 0–35)
AST: 21 U/L (ref 0–37)
BILIRUBIN TOTAL: 0.4 mg/dL (ref 0.2–1.2)
BUN: 7 mg/dL (ref 6–23)
CO2: 27 mEq/L (ref 19–32)
CREATININE: 0.75 mg/dL (ref 0.50–1.10)
Calcium: 9.3 mg/dL (ref 8.4–10.5)
Chloride: 103 mEq/L (ref 96–112)
GLUCOSE: 86 mg/dL (ref 70–99)
POTASSIUM: 3.8 meq/L (ref 3.5–5.3)
Sodium: 139 mEq/L (ref 135–145)
Total Protein: 6.9 g/dL (ref 6.0–8.3)

## 2014-04-09 LAB — POCT URINALYSIS DIPSTICK
Bilirubin, UA: NEGATIVE
Blood, UA: NEGATIVE
Glucose, UA: NEGATIVE
Ketones, UA: NEGATIVE
Leukocytes, UA: NEGATIVE
Nitrite, UA: NEGATIVE
Protein, UA: NEGATIVE
Spec Grav, UA: <=1.005
Urobilinogen, UA: NEGATIVE
pH, UA: 5

## 2014-04-09 MED ORDER — DILTIAZEM HCL ER BEADS 120 MG PO CP24: 120.0000 mg | ORAL_CAPSULE | Freq: Every day | ORAL | Status: DC

## 2014-04-09 NOTE — Progress Notes (Signed)
Subjective:    Patient ID: Stephanie Best, female    DOB: Aug 30, 1961, 53 y.o.   MRN: [id number]  HPI She is here for a complete examination. Recently she was switched to lisinopril for her blood pressure. She has had no difficulty with that. Her weight is unchanged. She does have a previous history of asthma but has not had difficulty with that recently. She colonoscopy in 2013 which did show tubular adenoma. Her immunizations were reviewed. She does need TDaP. She had a mammogram last year. She recently finished culinary arts school and is in the process of looking for a job. Her home life is going well. Her physical activity level is quite limited but now that she is to school she does plan to become more physically active. Her family and social history were reviewed.   Review of Systems  All other systems reviewed and are negative.      Objective:   Physical Exam BP 150/88  Pulse 64  Temp(Src) 97 F (36.1 C)  Resp 16  Ht 5\' 4"  (1.626 m)  Wt 234 lb (106.142 kg)  BMI 40.15 kg/m2  General Appearance:    Alert, cooperative, no distress, appears stated age  Head:    Normocephalic, without obvious abnormality, atraumatic  Eyes:    PERRL, conjunctiva/corneas clear, EOM's intact, fundi    benign  Ears:    Normal TM's and external ear canals  Nose:   Nares normal, mucosa normal, no drainage or sinus   tenderness  Throat:   Lips, mucosa, and tongue normal; teeth and gums normal  Neck:   Supple, no lymphadenopathy;  thyroid:  no   enlargement/tenderness/nodules; no carotid   bruit or JVD  Back:    Spine nontender, no curvature, ROM normal, no CVA     tenderness  Lungs:     Clear to auscultation bilaterally without wheezes, rales or     ronchi; respirations unlabored  Chest Wall:    No tenderness or deformity   Heart:    Regular rate and rhythm, S1 and S2 normal, no murmur, rub   or gallop  Breast Exam:    Deferred to GYN  Abdomen:     Soft, non-tender, nondistended, normoactive bowel  sounds,    no masses, no hepatosplenomegaly  Genitalia:    Deferred to GYN     Extremities:   No clubbing, cyanosis or edema  Pulses:   2+ and symmetric all extremities  Skin:   Skin color, texture, turgor normal, no rashes or lesions  Lymph nodes:   Cervical, supraclavicular, and axillary nodes normal  Neurologic:   CNII-XII intact, normal strength, sensation and gait; reflexes 2+ and symmetric throughout          Psych:   Normal mood, affect, hygiene and grooming.          Assessment & Plan:  Routine general medical examination at a health care facility - Plan: Urinalysis Dipstick, Comprehensive metabolic panel, Lipid panel  Personal history of colonic polyps  Obesity - Plan: Comprehensive metabolic panel, Lipid panel  Hypertension - Plan: Comprehensive metabolic panel, Lipid panel, diltiazem (TIAZAC) 120 MG 24 hr capsule  Asthma  Need for Tdap vaccination - Plan: Tdap vaccine greater than or equal to 7yo IM  I will add diltiazem back to her regimen to see if this will help with her pressure. Discussed options with her including increasing her physical activity even if it's 5 minute increments at a time. Also discussed making dietary changes.  Followup here in one month.

## 2014-04-12 NOTE — Progress Notes (Signed)
Pt called back advised labs look good, continue present meds.

## 2014-05-10 ENCOUNTER — Ambulatory Visit (INDEPENDENT_AMBULATORY_CARE_PROVIDER_SITE_OTHER): Payer: Medicaid Other | Admitting: Family Medicine

## 2014-05-10 ENCOUNTER — Encounter: Payer: Self-pay | Admitting: Family Medicine

## 2014-05-10 VITALS — BP 130/90 | HR 72 | Wt 240.0 lb

## 2014-05-10 DIAGNOSIS — E669 Obesity, unspecified: Secondary | ICD-10-CM

## 2014-05-10 DIAGNOSIS — I10 Essential (primary) hypertension: Secondary | ICD-10-CM

## 2014-05-10 NOTE — Progress Notes (Signed)
   Subjective:    Patient ID: Stephanie Best, female    DOB: 13-Oct-1961, 53 y.o.   MRN: [id number]  HPI Check. She has increased her physical activities and states she has made some dietary changes. She also was placed back on diltiazem.   Review of Systems     Objective:   Physical Exam Alert and in no distress. Blood pressure and weight are recorded.       Assessment & Plan:  Hypertension  Obesity  encouraged her to continue with her diet and exercise program. Encouraged her to cut back on carbohydrates. Recheck here in several months.

## 2014-08-18 ENCOUNTER — Telehealth: Payer: Self-pay | Admitting: Internal Medicine

## 2014-08-18 NOTE — Telephone Encounter (Signed)
Faxed over medical records to [facility] @ [phone] on 08/04/14

## 2014-08-21 ENCOUNTER — Other Ambulatory Visit: Payer: Self-pay | Admitting: Family Medicine

## 2014-08-25 ENCOUNTER — Telehealth: Payer: Self-pay | Admitting: Family Medicine

## 2014-08-25 NOTE — Telephone Encounter (Signed)
Explain to her that she needs to come in for an evaluation to order the proper testing and potentially refer to the right surgeon. OB/GYN does not handle this

## 2014-08-25 NOTE — Telephone Encounter (Signed)
PT INFORMED AND HAS APPOINTMENT 9/16

## 2014-08-25 NOTE — Telephone Encounter (Signed)
Pt called and states she went to her obgyn and was complaining with belly pain and they recommended she get a referral from you for her galbladder.   Pt ph [phone] ..   I advised pt she may need ov.

## 2014-08-26 ENCOUNTER — Encounter: Payer: Self-pay | Admitting: Family Medicine

## 2014-08-26 ENCOUNTER — Ambulatory Visit (INDEPENDENT_AMBULATORY_CARE_PROVIDER_SITE_OTHER): Payer: Medicaid Other | Admitting: Family Medicine

## 2014-08-26 VITALS — BP 140/90 | HR 80 | Ht 63.0 in | Wt 237.0 lb

## 2014-08-26 DIAGNOSIS — R1011 Right upper quadrant pain: Secondary | ICD-10-CM

## 2014-08-26 NOTE — Progress Notes (Signed)
   Subjective:    Patient ID: Stephanie Best, female    DOB: March 26, 1961, 53 y.o.   MRN: [id number]  HPI She complains of a 2 to 3 month history of intermittent right upper quadrant pain but no associated bloating, gas, vomiting. She cannot associate this with food, breathing or bowel habits. The pain lasts roughly 20 minutes and then goes away.   Review of Systems     Objective:   Physical Exam Alert and in no distress. Abdominal exam shows slight right upper quadrant discomfort but no true Murphy's sign or overuse punch. Bowel sounds normal.       Assessment & Plan:  Abdominal pain, right upper quadrant - Plan: US Abdomen Limited  discuss possible options concerning this however at this time she is to pay more attention to when she has the pain, was going on and were this might occur.

## 2014-08-28 ENCOUNTER — Telehealth: Payer: Self-pay | Admitting: Family Medicine

## 2014-09-02 ENCOUNTER — Ambulatory Visit
Admission: RE | Admit: 2014-09-02 | Discharge: 2014-09-02 | Disposition: A | Discharge status: Home / Self Care | Payer: Medicaid Other | Source: Ambulatory Visit | Attending: Family Medicine | Admitting: Family Medicine

## 2014-09-02 DIAGNOSIS — R1011 Right upper quadrant pain: Secondary | ICD-10-CM

## 2014-09-21 NOTE — Telephone Encounter (Signed)
Called pharmacy & they states Stephanie Best went thru [place], so approved.

## 2014-09-26 ENCOUNTER — Encounter (HOSPITAL_COMMUNITY): Payer: Self-pay | Admitting: Emergency Medicine

## 2014-09-26 ENCOUNTER — Emergency Department (HOSPITAL_COMMUNITY): Payer: Medicaid Other

## 2014-09-26 ENCOUNTER — Emergency Department (HOSPITAL_COMMUNITY)
Admission: EM | Admit: 2014-09-26 | Discharge: 2014-09-26 | Disposition: A | Discharge status: Home / Self Care | Payer: Self-pay | Attending: Emergency Medicine | Admitting: Emergency Medicine

## 2014-09-26 DIAGNOSIS — R079 Chest pain, unspecified: Secondary | ICD-10-CM | POA: Insufficient documentation

## 2014-09-26 DIAGNOSIS — Z8742 Personal history of other diseases of the female genital tract: Secondary | ICD-10-CM | POA: Insufficient documentation

## 2014-09-26 DIAGNOSIS — I10 Essential (primary) hypertension: Secondary | ICD-10-CM | POA: Insufficient documentation

## 2014-09-26 DIAGNOSIS — Z87440 Personal history of urinary (tract) infections: Secondary | ICD-10-CM | POA: Insufficient documentation

## 2014-09-26 DIAGNOSIS — H8110 Benign paroxysmal vertigo, unspecified ear: Secondary | ICD-10-CM

## 2014-09-26 DIAGNOSIS — H8113 Benign paroxysmal vertigo, bilateral: Secondary | ICD-10-CM | POA: Insufficient documentation

## 2014-09-26 DIAGNOSIS — J45909 Unspecified asthma, uncomplicated: Secondary | ICD-10-CM | POA: Insufficient documentation

## 2014-09-26 DIAGNOSIS — G43909 Migraine, unspecified, not intractable, without status migrainosus: Secondary | ICD-10-CM | POA: Insufficient documentation

## 2014-09-26 DIAGNOSIS — R200 Anesthesia of skin: Secondary | ICD-10-CM | POA: Insufficient documentation

## 2014-09-26 DIAGNOSIS — E669 Obesity, unspecified: Secondary | ICD-10-CM | POA: Insufficient documentation

## 2014-09-26 DIAGNOSIS — Z720 Tobacco use: Secondary | ICD-10-CM | POA: Insufficient documentation

## 2014-09-26 DIAGNOSIS — Z79899 Other long term (current) drug therapy: Secondary | ICD-10-CM | POA: Insufficient documentation

## 2014-09-26 DIAGNOSIS — K219 Gastro-esophageal reflux disease without esophagitis: Secondary | ICD-10-CM | POA: Insufficient documentation

## 2014-09-26 LAB — URINALYSIS, ROUTINE W REFLEX MICROSCOPIC
Bilirubin Urine: NEGATIVE
Glucose, UA: NEGATIVE mg/dL
HGB URINE DIPSTICK: NEGATIVE
Ketones, ur: NEGATIVE mg/dL
Leukocytes, UA: NEGATIVE
Nitrite: NEGATIVE
PROTEIN: NEGATIVE mg/dL
Specific Gravity, Urine: 1.02 (ref 1.005–1.030)
UROBILINOGEN UA: 1 mg/dL (ref 0.0–1.0)
pH: 7 (ref 5.0–8.0)

## 2014-09-26 LAB — COMPREHENSIVE METABOLIC PANEL
ALK PHOS: 86 U/L (ref 39–117)
ALT: 19 U/L (ref 0–35)
AST: 21 U/L (ref 0–37)
Albumin: 3.4 g/dL — ABNORMAL LOW (ref 3.5–5.2)
Anion gap: 12 (ref 5–15)
BUN: 16 mg/dL (ref 6–23)
CALCIUM: 9.2 mg/dL (ref 8.4–10.5)
CO2: 26 mEq/L (ref 19–32)
Chloride: 102 mEq/L (ref 96–112)
Creatinine, Ser: 0.75 mg/dL (ref 0.50–1.10)
GFR calc non Af Amer: >90 mL/min (ref >90)
GLUCOSE: 121 mg/dL — AB (ref 70–99)
Potassium: 3.4 mEq/L — ABNORMAL LOW (ref 3.7–5.3)
Sodium: 140 mEq/L (ref 137–147)
Total Bilirubin: <0.2 mg/dL — ABNORMAL LOW (ref 0.3–1.2)
Total Protein: 7 g/dL (ref 6.0–8.3)

## 2014-09-26 LAB — CBC WITH DIFFERENTIAL/PLATELET
BASOS ABS: 0 10*3/uL (ref 0.0–0.1)
BASOS PCT: 0 % (ref 0–1)
Eosinophils Absolute: 0.1 10*3/uL (ref 0.0–0.7)
Eosinophils Relative: 1 % (ref 0–5)
HCT: 40 % (ref 36.0–46.0)
Hemoglobin: 13.6 g/dL (ref 12.0–15.0)
Lymphocytes Relative: 32 % (ref 12–46)
Lymphs Abs: 2.2 10*3/uL (ref 0.7–4.0)
MCH: 28 pg (ref 26.0–34.0)
MCHC: 34 g/dL (ref 30.0–36.0)
MCV: 82.3 fL (ref 78.0–100.0)
MONOS PCT: 8 % (ref 3–12)
Monocytes Absolute: 0.6 10*3/uL (ref 0.1–1.0)
NEUTROS PCT: 59 % (ref 43–77)
Neutro Abs: 3.9 10*3/uL (ref 1.7–7.7)
Platelets: 249 10*3/uL (ref 150–400)
RBC: 4.86 MIL/uL (ref 3.87–5.11)
RDW: 13.3 % (ref 11.5–15.5)
WBC: 6.7 10*3/uL (ref 4.0–10.5)

## 2014-09-26 LAB — TROPONIN I: Troponin I: <0.3 ng/mL (ref <0.30)

## 2014-09-26 MED ORDER — MECLIZINE HCL 25 MG PO TABS: 25.0000 mg | ORAL_TABLET | Freq: Two times a day (BID) | ORAL | Status: DC | PRN

## 2014-09-26 MED ORDER — SODIUM CHLORIDE 0.9 % IV BOLUS (SEPSIS)
1000.0000 mL | Freq: Once | INTRAVENOUS | Status: AC
  Administered 2014-09-26: 1000 mL via INTRAVENOUS

## 2014-09-26 MED ORDER — MECLIZINE HCL 25 MG PO TABS
25.0000 mg | ORAL_TABLET | Freq: Once | ORAL | Status: AC
  Administered 2014-09-26: 25 mg via ORAL
  Filled 2014-09-26: qty 1

## 2014-09-26 NOTE — ED Notes (Addendum)
C/o dizziness, h/o vertigo, onset 2045, took goodies at 2130 w/o relief, developed nausea and vomited upon arrival to ED, alert, NAD, calm. States, "head feels numb". Denies pain. "Just dizzy and light headed and nausea".

## 2014-09-26 NOTE — ED Provider Notes (Signed)
Medical screening examination/treatment/procedure(s) were conducted as a shared visit with non-physician practitioner(s) and myself.  I personally evaluated the patient during the encounter.   EKG Interpretation   Date/Time:  Sunday September 26 2014 06:55:01 EDT Ventricular Rate:  69 PR Interval:  202 QRS Duration: 71 QT Interval:  411 QTC Calculation: 440 R Axis:   -25 Text Interpretation:  Sinus rhythm Borderline prolonged PR interval  Borderline left axis deviation Anteroseptal infarct, old Baseline wander  in lead(s) V3 When compared with ECG of 03/16/2014, No significant change  was found Confirmed by GLICK  MD, DAVID ([id number]) on 09/26/2014 7:03:52 AM      53  year old female with a history of vertigo presenting with dizziness. Described as room spinning.  On exam, well appearing, nontoxic, not distressed, normal respiratory effort, normal perfusion, PERRL, EOMI, no nystagmus, normal test of skew, normal gait without ataxia. Symptoms likely to be peripheral vertigo DVT in intermittent course, fatigability, normal neurologic exam. Plan to treat with meclizine and reassess.  Clinical Impression: Vertigo   Artis Delay, MD 09/27/14 1255

## 2014-09-26 NOTE — Discharge Instructions (Signed)
Please call your doctor for a followup appointment within 24-48 hours. When you talk to your doctor please let them know that you were seen in the emergency department and have them acquire all of your records so that they can discuss the findings with you and formulate a treatment plan to fully care for your new and ongoing problems. Please call and set-up an appointment with your primary care provider to be seen and re-assessed by the end of this week Please call and set-up an appointment with Neurology Please rest and stay hydrated Please take medications as prescribed and as needed Please drink plenty of water Please continue to monitor symptoms closely and if symptoms are to worsen or change (fever greater than 101, chills, sweating, nausea, vomiting, chest pain, shortness of breathe, difficulty breathing, weakness, numbness, tingling, worsening or changes to pain pattern, visual changes, loss of vision, blurred vision, neck pain, neck stiffness, loss of sensation, weakness to the body) please report back to the Emergency Department immediately.    Benign Positional Vertigo Vertigo means you feel like you or your surroundings are moving when they are not. Benign positional vertigo is the most common form of vertigo. Benign means that the cause of your condition is not serious. Benign positional vertigo is more common in older adults. CAUSES  Benign positional vertigo is the result of an upset in the labyrinth system. This is an area in the middle ear that helps control your balance. This may be caused by a viral infection, head injury, or repetitive motion. However, often no specific cause is found. SYMPTOMS  Symptoms of benign positional vertigo occur when you move your head or eyes in different directions. Some of the symptoms may include:  Loss of balance and falls.  Vomiting.  Blurred vision.  Dizziness.  Nausea.  Involuntary eye movements (nystagmus). DIAGNOSIS  Benign  positional vertigo is usually diagnosed by physical exam. If the specific cause of your benign positional vertigo is unknown, your caregiver may perform imaging tests, such as magnetic resonance imaging (MRI) or computed tomography (CT). TREATMENT  Your caregiver may recommend movements or procedures to correct the benign positional vertigo. Medicines such as meclizine, benzodiazepines, and medicines for nausea may be used to treat your symptoms. In rare cases, if your symptoms are caused by certain conditions that affect the inner ear, you may need surgery. HOME CARE INSTRUCTIONS   Follow your caregiver's instructions.  Move slowly. Do not make sudden body or head movements.  Avoid driving.  Avoid operating heavy machinery.  Avoid performing any tasks that would be dangerous to you or others during a vertigo episode.  Drink enough fluids to keep your urine clear or pale yellow. SEEK IMMEDIATE MEDICAL CARE IF:   You develop problems with walking, weakness, numbness, or using your arms, hands, or legs.  You have difficulty speaking.  You develop severe headaches.  Your nausea or vomiting continues or gets worse.  You develop visual changes.  Your family or friends notice any behavioral changes.  Your condition gets worse.  You have a fever.  You develop a stiff neck or sensitivity to light. MAKE SURE YOU:   Understand these instructions.  Will watch your condition.  Will get help right away if you are not doing well or get worse. Document Released: 09/03/2006 Document Revised: 02/18/2012 Document Reviewed: 08/16/2011 [facility] Patient Information 2015 [place], [place]. This information is not intended to replace advice given to you by your health care provider. Make sure you discuss any  questions you have with your health care provider.

## 2014-09-26 NOTE — ED Notes (Signed)
Pt returned from CT scan.

## 2014-09-26 NOTE — ED Notes (Signed)
Patient transported to CT 

## 2014-09-26 NOTE — ED Notes (Addendum)
Pt ambulated to the bathroom without difficultly. Given graham crackers. Provider at the bedside.

## 2014-09-26 NOTE — ED Notes (Signed)
Patient states she does not check her BP and doesn't know what it normally runs. BP is very elevated at this time.

## 2014-09-26 NOTE — ED Provider Notes (Signed)
CSN: [id number]     Arrival date & time 09/26/14  0621 History   First MD Initiated Contact with Patient 09/26/14 [phone]     Chief Complaint  Patient presents with  . Dizziness  . Emesis     (Consider location/radiation/quality/duration/timing/severity/associated sxs/prior Treatment) The history is provided by the patient. No language interpreter was used.  Stephanie Best is a 53 y/o F with PMHx of asthma, HTN, vertigo presenting to the ED with dizziness, emesis, nausea that started suddenly at 8:30-9:00PM last night while the patient was driving. Patient reported that it feels as if her brain is "numb." Stated that she has been feeling nauseous - reported that she has had at least 2 episodes of emesis - NB/NB - earlier this morning. Patient reported that when she walks she feels off balance. Stated that she has not been taking anything for the discomfort except for her home medications that she normally takes. Stated that she had similar symptoms earlier this year, but stated that these symptoms she presents with today are more intense and lasting longer. Denied chest pain, shortness of breath, difficulty breathing, abdominal pain, neck pain, neck stiffness, fever, chills, cough, nasal congestion, blurred vision, sudden loss of vision, numbness, tingling, loss of sensation, weakness, falls or head injury. PCP Dr. Redmond School  Past Medical History  Diagnosis Date  . Asthma   . Hypertension   . Obesity   . Vertigo 03/2014   Past Surgical History  Procedure Laterality Date  . Breast reduction surgery  1998  . Knee arthroscopy  1995,06/2008    LEFT,RIGHT  . Abdominal hysterectomy    . Shoulder surgery      tumor removal   Family History  Problem Relation Age of Onset  . Hypertension Mother   . Cancer Father 23    Stomach cancer  . Stomach cancer Father 37  . Colon cancer Neg Hx   . Colon polyps Neg Hx   . Rectal cancer Neg Hx    History  Substance Use Topics  . Smoking status:  Never Smoker   . Smokeless tobacco: Never Used  . Alcohol Use: 0.6 oz/week    1 Glasses of wine per week   OB History   Grav Para Term Preterm Abortions TAB SAB Ect Mult Living                 Review of Systems  Constitutional: Negative for fever and chills.  Respiratory: Negative for chest tightness and shortness of breath.   Cardiovascular: Negative for chest pain.  Gastrointestinal: Positive for nausea and vomiting. Negative for abdominal pain, diarrhea, constipation, blood in stool and anal bleeding.  Musculoskeletal: Negative for back pain and neck pain.  Neurological: Positive for dizziness. Negative for light-headedness.      Allergies  Review of patient's allergies indicates no known allergies.  Home Medications   Prior to Admission medications   Medication Sig Start Date End Date Taking? Authorizing Provider  Aspirin-Acetaminophen-Caffeine (GOODY HEADACHE PO) Take 1 packet by mouth daily as needed (pain).   Yes Historical Provider, MD  diltiazem (TIAZAC) 120 MG 24 hr capsule Take 1 capsule (120 mg total) by mouth daily. 04/09/14  Yes Denita Lung, MD  lisinopril-hydrochlorothiazide (PRINZIDE,ZESTORETIC) 20-12.5 MG per tablet Take 1 tablet by mouth daily.   Yes Historical Provider, MD  meclizine (ANTIVERT) 25 MG tablet Take 1 tablet (25 mg total) by mouth 2 (two) times daily as needed for dizziness. 09/26/14   Jamse Mead, PA-C  BP 150/71  Pulse 67  Temp(Src) 97.6 F (36.4 C)  Resp 18  SpO2 99% Physical Exam  Nursing note and vitals reviewed. Constitutional: She is oriented to person, place, and time. She appears well-developed and well-nourished. No distress.  HENT:  Head: Normocephalic and atraumatic.  Mouth/Throat: Oropharynx is clear and moist. No oropharyngeal exudate.  Eyes: Conjunctivae and EOM are normal. Pupils are equal, round, and reactive to light. Right eye exhibits no discharge. Left eye exhibits no discharge.  Minimal horizontal  nystagmus Visual fields grossly intact EOMs intact  Neck: Normal range of motion. Neck supple. No tracheal deviation present.  Negative neck stiffness Negative nuchal rigidity Negative cervical lymphadenopathy Negative meningeal signs  Cardiovascular: Normal rate, regular rhythm and normal heart sounds.  Exam reveals no friction rub.   No murmur heard. Pulses:      Radial pulses are 2+ on the right side, and 2+ on the left side.       Dorsalis pedis pulses are 2+ on the right side, and 2+ on the left side.  Cap refill less than 3 seconds Negative swelling or pitting edema identified to lower extremities bilaterally  Pulmonary/Chest: Effort normal and breath sounds normal. No respiratory distress. She has no wheezes. She has no rales. She exhibits no tenderness.  Musculoskeletal: Normal range of motion.  Full ROM to upper and lower extremities without difficulty noted, negative ataxia noted.  Lymphadenopathy:    She has no cervical adenopathy.  Neurological: She is alert and oriented to person, place, and time. No cranial nerve deficit. She exhibits normal muscle tone. Coordination normal.  Cranial nerves III-XII grossly intact Strength 5+/5+ to upper and lower extremities bilaterally with resistance applied, equal distribution noted Equal grip strength bilaterally Negative facial droop Negative slurred speech Negative aphasia Patient able to bring finger to nose bilaterally without difficulty or ataxia Patient follows commands well Patient response to questions appropriately GCS 15 Negative arm drift Fine motor skills intact Heel to knee down shin normal bilaterally  Skin: Skin is warm and dry. No rash noted. She is not diaphoretic. No erythema.  Psychiatric: She has a normal mood and affect. Her behavior is normal. Thought content normal.    ED Course  Procedures (including critical care time)  8:49 AM Patient seen and assessed by attending physician, Dr. Maryanna Shape, who  agrees with diagnosis of peripheral vertigo. Agreed to Meclizine.   Results for orders placed during the hospital encounter of 09/26/14  TROPONIN I      Result Value Ref Range   Troponin I <0.30  <0.30 ng/mL  CBC WITH DIFFERENTIAL      Result Value Ref Range   WBC 6.7  4.0 - 10.5 K/uL   RBC 4.86  3.87 - 5.11 MIL/uL   Hemoglobin 13.6  12.0 - 15.0 g/dL   HCT 40.0  36.0 - 46.0 %   MCV 82.3  78.0 - 100.0 fL   MCH 28.0  26.0 - 34.0 pg   MCHC 34.0  30.0 - 36.0 g/dL   RDW 13.3  11.5 - 15.5 %   Platelets 249  150 - 400 K/uL   Neutrophils Relative % 59  43 - 77 %   Neutro Abs 3.9  1.7 - 7.7 K/uL   Lymphocytes Relative 32  12 - 46 %   Lymphs Abs 2.2  0.7 - 4.0 K/uL   Monocytes Relative 8  3 - 12 %   Monocytes Absolute 0.6  0.1 - 1.0 K/uL   Eosinophils  Relative 1  0 - 5 %   Eosinophils Absolute 0.1  0.0 - 0.7 K/uL   Basophils Relative 0  0 - 1 %   Basophils Absolute 0.0  0.0 - 0.1 K/uL  COMPREHENSIVE METABOLIC PANEL      Result Value Ref Range   Sodium 140  137 - 147 mEq/L   Potassium 3.4 (*) 3.7 - 5.3 mEq/L   Chloride 102  96 - 112 mEq/L   CO2 26  19 - 32 mEq/L   Glucose, Bld 121 (*) 70 - 99 mg/dL   BUN 16  6 - 23 mg/dL   Creatinine, Ser 0.75  0.50 - 1.10 mg/dL   Calcium 9.2  8.4 - 10.5 mg/dL   Total Protein 7.0  6.0 - 8.3 g/dL   Albumin 3.4 (*) 3.5 - 5.2 g/dL   AST 21  0 - 37 U/L   ALT 19  0 - 35 U/L   Alkaline Phosphatase 86  39 - 117 U/L   Total Bilirubin <0.2 (*) 0.3 - 1.2 mg/dL   GFR calc non Af Amer >90  >90 mL/min   GFR calc Af Amer >90  >90 mL/min   Anion gap 12  5 - 15  URINALYSIS, ROUTINE W REFLEX MICROSCOPIC      Result Value Ref Range   Color, Urine YELLOW  YELLOW   APPearance CLOUDY (*) CLEAR   Specific Gravity, Urine 1.020  1.005 - 1.030   pH 7.0  5.0 - 8.0   Glucose, UA NEGATIVE  NEGATIVE mg/dL   Hgb urine dipstick NEGATIVE  NEGATIVE   Bilirubin Urine NEGATIVE  NEGATIVE   Ketones, ur NEGATIVE  NEGATIVE mg/dL   Protein, ur NEGATIVE  NEGATIVE mg/dL    Urobilinogen, UA 1.0  0.0 - 1.0 mg/dL   Nitrite NEGATIVE  NEGATIVE   Leukocytes, UA NEGATIVE  NEGATIVE    Labs Review Labs Reviewed  COMPREHENSIVE METABOLIC PANEL - Abnormal; Notable for the following:    Potassium 3.4 (*)    Glucose, Bld 121 (*)    Albumin 3.4 (*)    Total Bilirubin <0.2 (*)    All other components within normal limits  URINALYSIS, ROUTINE W REFLEX MICROSCOPIC - Abnormal; Notable for the following:    APPearance CLOUDY (*)    All other components within normal limits  TROPONIN I  CBC WITH DIFFERENTIAL    Imaging Review Ct Head Wo Contrast  09/26/2014   CLINICAL DATA:  Dizziness with nausea and emesis.  Hypertension.  EXAM: CT HEAD WITHOUT CONTRAST  TECHNIQUE: Contiguous axial images were obtained from the base of the skull through the vertex without intravenous contrast.  COMPARISON:  03/16/2014  FINDINGS: Ventricles, cisterns and other CSF spaces are within normal. There is no mass, mass effect, shift of midline structures or acute hemorrhage. There is no evidence of acute infarction. Remaining bones and soft tissues are within normal.  IMPRESSION: No acute intracranial findings.   Electronically Signed   By: Marin Olp M.D.   On: 09/26/2014 09:03     EKG Interpretation   Date/Time:  Sunday September 26 2014 06:55:01 EDT Ventricular Rate:  69 PR Interval:  202 QRS Duration: 71 QT Interval:  411 QTC Calculation: 440 R Axis:   -25 Text Interpretation:  Sinus rhythm Borderline prolonged PR interval  Borderline left axis deviation Anteroseptal infarct, old Baseline wander  in lead(s) V3 When compared with ECG of 03/16/2014, No significant change  was found Confirmed by [facility]  MD, DAVID ([id number])  on 09/26/2014 7:03:52 AM      9:57 AM Patient re-assessed by this provider. Patient sitting upright in bed, appears more comfortable than before. Patient interactive and laughing. Patient asking to eat. Patient reported that she feels a lot better, stating that the  dizziness has improved.   10:52 AM This provider re-assessed the patient. Patient reported that she is feeling "wonderful" - stated that she ambulated in the ED without difficulty. And dizziness has resolved. Patient reported that she is comfortable to go home.   MDM   Final diagnoses:  Benign positional vertigo, unspecified laterality    Medications  sodium chloride 0.9 % bolus 1,000 mL (0 mLs Intravenous Stopped 09/26/14 1000)  meclizine (ANTIVERT) tablet 25 mg (25 mg Oral Given 09/26/14 0925)   Filed Vitals:   09/26/14 1572 09/26/14 0925 09/26/14 0930 09/26/14 0945  BP:  158/74 144/78 150/71  Pulse:   68 67  Temp: 97.6 F (36.4 C)     Resp:  14 18 18   SpO2:  100% 99% 99%   This provider reviewed patient's chart. Patient was last seen and assessed in ED setting on 03/16/2014 regarding vertigo-like symptoms. Patient presented with similar symptoms and she presents with today. Patient had a normal CT head performed. Patient is treated with meclizine and discharged home. EKG noted sinus rhythm with heart rate of 69 beats per minute-borderline prolonged PR interval with borderline left axis deviation-can change since last tracing. Troponin negative elelvation. CBC unremarkable. CMP noted mildly low potassium of 3.4. Urinalysis unremarkable-negative findings of infection. CT head negative for acute intracranial abnormalities.  Doubt central vertigo. Doubt posterior vertebral stroke. Suspicion to be peripheral vertigo. Negative focal neurological deficits. Imaging and labs reassuring. Symptoms improved with meclizine. Gait proper with negative step-offs or sway. Patient seen and assessed by attending physician, Dr. Maryanna Shape who agreed to plan of discharge. Patient stable, afebrile. Patient not septic appearing. Discharged patient. Discharged patient with meclizine prn. Educated patient on medication and how to take it. Referred to PCP and Neurology. Discussed with patient to closely monitor  symptoms and if symptoms are to worsen or change to report back to the ED - strict return instructions given.  Patient agreed to plan of care, understood, all questions answered.   Jamse Mead, PA-C 09/26/14 1108

## 2014-09-26 NOTE — ED Notes (Signed)
Given [place] sandwiches to pt and friend

## 2014-09-27 NOTE — ED Provider Notes (Signed)
Medical screening examination/treatment/procedure(s) were conducted as a shared visit with non-physician practitioner(s) and myself.  I personally evaluated the patient during the encounter.   EKG Interpretation   Date/Time:  Sunday September 26 2014 06:55:01 EDT Ventricular Rate:  69 PR Interval:  202 QRS Duration: 71 QT Interval:  411 QTC Calculation: 440 R Axis:   -25 Text Interpretation:  Sinus rhythm Borderline prolonged PR interval  Borderline left axis deviation Anteroseptal infarct, old Baseline wander  in lead(s) V3 When compared with ECG of 03/16/2014, No significant change  was found Confirmed by [facility]  MD, DAVID ([id number]) on 09/26/2014 7:03:52 AM        Artis Delay, MD 09/27/14 1255

## 2014-10-12 ENCOUNTER — Telehealth: Payer: Self-pay | Admitting: Family Medicine

## 2014-10-12 ENCOUNTER — Emergency Department (HOSPITAL_COMMUNITY)
Admission: EM | Admit: 2014-10-12 | Discharge: 2014-10-12 | Disposition: A | Discharge status: Home / Self Care | Payer: Medicaid Other | Attending: Emergency Medicine | Admitting: Emergency Medicine

## 2014-10-12 ENCOUNTER — Encounter (HOSPITAL_COMMUNITY): Payer: Self-pay

## 2014-10-12 DIAGNOSIS — R42 Dizziness and giddiness: Secondary | ICD-10-CM | POA: Insufficient documentation

## 2014-10-12 DIAGNOSIS — R112 Nausea with vomiting, unspecified: Secondary | ICD-10-CM | POA: Insufficient documentation

## 2014-10-12 DIAGNOSIS — Z79899 Other long term (current) drug therapy: Secondary | ICD-10-CM | POA: Insufficient documentation

## 2014-10-12 DIAGNOSIS — I10 Essential (primary) hypertension: Secondary | ICD-10-CM | POA: Insufficient documentation

## 2014-10-12 DIAGNOSIS — E669 Obesity, unspecified: Secondary | ICD-10-CM | POA: Insufficient documentation

## 2014-10-12 DIAGNOSIS — R200 Anesthesia of skin: Secondary | ICD-10-CM | POA: Insufficient documentation

## 2014-10-12 DIAGNOSIS — J45909 Unspecified asthma, uncomplicated: Secondary | ICD-10-CM | POA: Insufficient documentation

## 2014-10-12 LAB — CBC
HCT: 42.2 % (ref 36.0–46.0)
Hemoglobin: 14.4 g/dL (ref 12.0–15.0)
MCH: 28 pg (ref 26.0–34.0)
MCHC: 34.1 g/dL (ref 30.0–36.0)
MCV: 81.9 fL (ref 78.0–100.0)
PLATELETS: 295 10*3/uL (ref 150–400)
RBC: 5.15 MIL/uL — ABNORMAL HIGH (ref 3.87–5.11)
RDW: 13.7 % (ref 11.5–15.5)
WBC: 8.7 10*3/uL (ref 4.0–10.5)

## 2014-10-12 LAB — BASIC METABOLIC PANEL
ANION GAP: 13 (ref 5–15)
BUN: 6 mg/dL (ref 6–23)
CHLORIDE: 101 meq/L (ref 96–112)
CO2: 24 mEq/L (ref 19–32)
Calcium: 9.5 mg/dL (ref 8.4–10.5)
Creatinine, Ser: 0.63 mg/dL (ref 0.50–1.10)
GFR calc Af Amer: >90 mL/min (ref >90)
Glucose, Bld: 102 mg/dL — ABNORMAL HIGH (ref 70–99)
Potassium: 3.9 mEq/L (ref 3.7–5.3)
Sodium: 138 mEq/L (ref 137–147)

## 2014-10-12 LAB — CBG MONITORING, ED: Glucose-Capillary: 102 mg/dL — ABNORMAL HIGH (ref 70–99)

## 2014-10-12 MED ORDER — LORAZEPAM 1 MG PO TABS
0.5000 mg | ORAL_TABLET | Freq: Once | ORAL | Status: AC
  Administered 2014-10-12: 0.5 mg via ORAL
  Filled 2014-10-12: qty 1

## 2014-10-12 MED ORDER — MECLIZINE HCL 25 MG PO TABS
50.0000 mg | ORAL_TABLET | Freq: Once | ORAL | Status: AC
  Administered 2014-10-12: 50 mg via ORAL
  Filled 2014-10-12: qty 2

## 2014-10-12 NOTE — ED Provider Notes (Signed)
CSN: [id number]     Arrival date & time 10/12/14  3536 History   First MD Initiated Contact with Patient 10/12/14 [phone]     Chief Complaint  Patient presents with  . Dizziness   Patient is a 53 y.o. female presenting with dizziness. The history is provided by the patient.  Dizziness Quality:  Room spinning Severity:  Severe Onset quality:  Sudden Duration:  1 day Timing:  Constant Progression:  Unchanged Chronicity:  Recurrent (hx of same) Context: not with eye movement and not with loss of consciousness   Relieved by:  Nothing Ineffective treatments:  Closing eyes and medication (meclizine) Associated symptoms: nausea and vomiting   Associated symptoms: no chest pain, no diarrhea, no headaches and no shortness of breath   Nausea:    Severity:  Severe   Onset quality:  Sudden   Duration:  1 day   Timing:  Intermittent   Progression:  Waxing and waning Vomiting:    Quality:  Stomach contents   Severity:  Moderate   Duration:  1 day   Progression:  Resolved Risk factors: hx of vertigo   Risk factors: no hx of stroke     Past Medical History  Diagnosis Date  . Asthma   . Hypertension   . Obesity   . Vertigo 03/2014   Past Surgical History  Procedure Laterality Date  . Breast reduction surgery  1998  . Knee arthroscopy  1995,06/2008    LEFT,RIGHT  . Abdominal hysterectomy    . Shoulder surgery      tumor removal   Family History  Problem Relation Age of Onset  . Hypertension Mother   . Cancer Father 67    Stomach cancer  . Stomach cancer Father 36  . Colon cancer Neg Hx   . Colon polyps Neg Hx   . Rectal cancer Neg Hx    History  Substance Use Topics  . Smoking status: Never Smoker   . Smokeless tobacco: Never Used  . Alcohol Use: 0.6 oz/week    1 Glasses of wine per week   OB History    No data available     Review of Systems  Constitutional: Negative for fever, chills and activity change.  Eyes: Negative for visual disturbance.  Respiratory:  Negative for cough and shortness of breath.   Cardiovascular: Negative for chest pain.  Gastrointestinal: Positive for nausea and vomiting. Negative for abdominal pain and diarrhea.  Musculoskeletal: Negative for neck pain.  Neurological: Positive for dizziness and numbness ("feels like my head is numb"). Negative for speech difficulty and headaches.  All other systems reviewed and are negative.   Allergies  Review of patient's allergies indicates no known allergies.  Home Medications   Prior to Admission medications   Medication Sig Start Date End Date Taking? Authorizing Provider  Aspirin-Acetaminophen-Caffeine (GOODY HEADACHE PO) Take 1 packet by mouth daily as needed (pain).    Historical Provider, MD  diltiazem (TIAZAC) 120 MG 24 hr capsule Take 1 capsule (120 mg total) by mouth daily. 04/09/14   Denita Lung, MD  lisinopril-hydrochlorothiazide (PRINZIDE,ZESTORETIC) 20-12.5 MG per tablet Take 1 tablet by mouth daily.    Historical Provider, MD  meclizine (ANTIVERT) 25 MG tablet Take 1 tablet (25 mg total) by mouth 2 (two) times daily as needed for dizziness. 09/26/14   Marissa Sciacca, PA-C   BP 157/72 mmHg  Pulse 74  Temp(Src) 97.3 F (36.3 C) (Oral)  Resp 17  Ht 5\' 4"  (1.626 m)  Wt  235 lb (106.595 kg)  BMI 40.32 kg/m2  SpO2 96%   Physical Exam  Constitutional: She is oriented to person, place, and time. She appears well-developed and well-nourished. She is cooperative.  Middle-aged female, sitting up in bed, eyes closed, smiling/laughing  HENT:  Head: Normocephalic and atraumatic.  Right Ear: External ear normal.  Left Ear: External ear normal.  Nose: Nose normal.  Mouth/Throat: Uvula is midline and oropharynx is clear and moist.  Eyes: EOM are normal. Pupils are equal, round, and reactive to light.  Neck: Phonation normal. No muscular tenderness present.  Cardiovascular: Normal rate and regular rhythm.   Pulses:      Radial pulses are 2+ on the right side, and 2+ on  the left side.       Dorsalis pedis pulses are 2+ on the right side, and 2+ on the left side.       Posterior tibial pulses are 2+ on the right side, and 2+ on the left side.  No peripheral edema  Pulmonary/Chest: Effort normal and breath sounds normal.  Abdominal: Soft. She exhibits no distension. There is no tenderness.  Neurological: She is alert and oriented to person, place, and time. She displays no tremor. She exhibits normal muscle tone. Coordination and gait normal. GCS eye subscore is 4. GCS verbal subscore is 5. GCS motor subscore is 6.  Face symmetric with raising eyebrows, smiling, normal strength with squeezing eyes shut and clenching jaw, sensation to light touch intact in V1-V3, no tongue deviation, uvula midline, PERRL, normal EOM with no nystagmus, normal strength with shoulder shrug, normal strength and sensation in all extremities, normal finger to nose bilaterally, normal rapid alternating movement bilaterally, normal heel to shin bilaterally; normal HINTs testing - able to focus on my forehead with movement of her head both directions; speech is clear and coherent  Psychiatric: She has a normal mood and affect.    ED Course  Procedures   Labs Review  Results for orders placed or performed during the hospital encounter of 10/12/14  CBC  (at [facility])  Result Value Ref Range   WBC 8.7 4.0 - 10.5 K/uL   RBC 5.15 (H) 3.87 - 5.11 MIL/uL   Hemoglobin 14.4 12.0 - 15.0 g/dL   HCT 42.2 36.0 - 46.0 %   MCV 81.9 78.0 - 100.0 fL   MCH 28.0 26.0 - 34.0 pg   MCHC 34.1 30.0 - 36.0 g/dL   RDW 13.7 11.5 - 15.5 %   Platelets 295 150 - 400 K/uL  Basic metabolic panel  (at [facility])  Result Value Ref Range   Sodium 138 137 - 147 mEq/L   Potassium 3.9 3.7 - 5.3 mEq/L   Chloride 101 96 - 112 mEq/L   CO2 24 19 - 32 mEq/L   Glucose, Bld 102 (H) 70 - 99 mg/dL   BUN 6 6 - 23 mg/dL   Creatinine, Ser 0.63 0.50 - 1.10 mg/dL   Calcium 9.5 8.4 - 10.5 mg/dL   GFR  calc non Af Amer >90 >90 mL/min   GFR calc Af Amer >90 >90 mL/min   Anion gap 13 5 - 15  CBG, ED  Result Value Ref Range   Glucose-Capillary 102 (H) 70 - 99 mg/dL   Comment 1 Documented in Chart     EKG Interpretation   Date/Time:  Tuesday October 12 2014 08:41:24 EST Ventricular Rate:  75 PR Interval:  176 QRS Duration: 68 QT Interval:  398  QTC Calculation: 444 R Axis:   -7 Text Interpretation:  Normal sinus rhythm Septal infarct , age  undetermined Abnormal ECG Sinus rhythm T wave inversion Abnormal ekg  Confirmed by Carmin Muskrat  MD [phone]) on 10/12/2014 10:12:53 AM      MDM   Final diagnoses:  Dizziness   53 y.o. female presents with a room spinning sensation that began last night. She took meclizine with no relief. Associated with nausea/vomiting. Has a history of same. No headache, fever, chills, abdominal pain, neck pain, chest pain, shortness of breath.   Neuro exam benign. See exam above.   Will give meclizine and ativan for symptomatic control. No evidence that this is a central process. No headache - no concern for central venous thrombosis, no concern for vertebral artery dissection, no concern for SAH.  Ambulated in the hallway with her RNs with no difficulty.   10:12 AM Patient reassessed - feeling somewhat better after meclizine; will reassess after she gets Ativan  11:16 AM Patient reassessed - still feeling some "room spinning" sensation; ambulated with me steadily, no leaning or falling  11:51 AM Patient reassessed - no more feeling of room spinning; no headache (denies ever having headache); feeling better  With a completely reassuring exam and improvement with medication, do not feel this is a CVA, dissection, or vertebrobasilar insufficiency. Labs obtained and are unremarkable. EKG showed NSR, no acute process or ischemia. The patient denies ever feeling lightheaded -- "dizzy" feeling she was having is always "room spinning" per the  patient.  Strict return precautions discussed with the patient and given in writing. She is to follow up with a neurologist (contact info given) and her PCP.   This case managed in conjunction with my attending, Dr. Vanita Panda.   Berenice Primas, MD 10/12/14 1218

## 2014-10-12 NOTE — ED Notes (Signed)
Nicki Reaper, RN aware of cbg results

## 2014-10-12 NOTE — ED Notes (Signed)
Pt. Presents to ED with complaint of dizziness and room spinning. Pt. States that she feels like she is leaning to right. States that her head hurts and feels numb, but denies numbness anywhere else. Pt. Denies fever/chills but endorses N/V. Denies chest pain or abd pain.

## 2014-10-12 NOTE — Telephone Encounter (Signed)
ER letter sent 

## 2014-10-12 NOTE — Discharge Instructions (Signed)

## 2014-10-12 NOTE — ED Notes (Signed)
POCT CBG resulted 102

## 2014-11-08 ENCOUNTER — Ambulatory Visit: Payer: Medicaid Other | Admitting: Family Medicine

## 2014-11-09 ENCOUNTER — Other Ambulatory Visit: Payer: Self-pay

## 2014-11-09 ENCOUNTER — Ambulatory Visit (INDEPENDENT_AMBULATORY_CARE_PROVIDER_SITE_OTHER): Payer: BC Managed Care – PPO | Admitting: Family Medicine

## 2014-11-09 VITALS — BP 128/88 | HR 90 | Wt 243.0 lb

## 2014-11-09 DIAGNOSIS — R42 Dizziness and giddiness: Secondary | ICD-10-CM

## 2014-11-09 MED ORDER — MECLIZINE HCL 25 MG PO TABS: 25.0000 mg | ORAL_TABLET | Freq: Two times a day (BID) | ORAL | Status: DC | PRN

## 2014-11-09 NOTE — Progress Notes (Signed)
   Subjective:    Patient ID: Stephanie Best, female    DOB: 1961-09-28, 53 y.o.   MRN: [id number]  HPI She has been having difficulty with intermittent dizziness since her visit to the emergency room October 18. She describes dizziness but no blurred or double vision. She did note that she would tend to fall to the right. She did have difficulty with nausea and vomiting. She also notes ringing in her ears associated with the dizziness. No difficulty hearing.   Review of Systems     Objective:   Physical Exam Alert and in no distress. EOMI. Other cranial nerves grossly intact. Bilateral hearing screen was normal. Cerebellar testing did show her to have a fall to her right slightly. Normal finger to nose. DTRs difficult to determine appeared normal. Cardiac and lung exam normal. TMs clear. Throat clear. Neck is supple without adenopathy.       Assessment & Plan:  Vertigo - Plan: Ambulatory referral to ENT, meclizine (ANTIVERT) 25 MG tablet  this is possibly Mnire's disease however she has no hearing deficit. I'm also concerned about her falling to the right. ENT evaluation and then possibly neurology after that if needed.

## 2014-11-10 ENCOUNTER — Other Ambulatory Visit: Payer: Self-pay | Admitting: Family Medicine

## 2014-11-10 DIAGNOSIS — R42 Dizziness and giddiness: Secondary | ICD-10-CM

## 2014-11-10 MED ORDER — MECLIZINE HCL 25 MG PO TABS: 25.0000 mg | ORAL_TABLET | Freq: Two times a day (BID) | ORAL | Status: DC | PRN

## 2014-12-17 ENCOUNTER — Ambulatory Visit (INDEPENDENT_AMBULATORY_CARE_PROVIDER_SITE_OTHER): Payer: BLUE CROSS/BLUE SHIELD | Admitting: Family Medicine

## 2014-12-17 ENCOUNTER — Encounter: Payer: Self-pay | Admitting: Family Medicine

## 2014-12-17 VITALS — BP 140/86 | HR 91 | Wt 248.0 lb

## 2014-12-17 DIAGNOSIS — R42 Dizziness and giddiness: Secondary | ICD-10-CM

## 2014-12-17 NOTE — Progress Notes (Signed)
   Subjective:    Patient ID: Stephanie Best, female    DOB: [birth year], 54 y.o.   MRN: [id number]  HPI She is here for a recheck on her dizziness. She was seen by ENT and apparently nothing was identified. She was symptom-free for approximately one month and then after sneezing she had a recurrence of dizziness. It lasted roughly 30 minutes and has been intermittent since then. This occurred the other day. She did try meclizine which did make her drowsy but did not help much. She's had no blurred vision, double vision, nausea, weakness, numbness or tingling. Head position has no particular effect on this.   Review of Systems     Objective:   Physical Exam alert and in no distress. EOMI. Full motion of the neck. DTRs normal. Cerebellar testing showed her to be slightly unstable but she did not fall and any one direction. Normal finger to nose. No pronator drift. Tympanic membranes and canals are normal. Throat is clear. Tonsils are normal. Neck is supple without adenopathy or thyromegaly. Cardiac exam shows a regular sinus rhythm without murmurs or gallops. Lungs are clear to auscultation.        Assessment & Plan:  Vertigo - Plan: Ambulatory referral to Neurology  the etiology of her dizziness is unclear. Did recommend smaller dose of meclizine to see if this will help.

## 2014-12-17 NOTE — Patient Instructions (Signed)
Take one half of the meclizine 2 or 3 times per day as needed for dizziness

## 2014-12-24 ENCOUNTER — Encounter: Payer: Self-pay | Admitting: Neurology

## 2014-12-24 ENCOUNTER — Ambulatory Visit (INDEPENDENT_AMBULATORY_CARE_PROVIDER_SITE_OTHER): Payer: BLUE CROSS/BLUE SHIELD | Admitting: Neurology

## 2014-12-24 VITALS — BP 160/100 | HR 83 | Ht 64.0 in | Wt 251.1 lb

## 2014-12-24 DIAGNOSIS — R51 Headache: Secondary | ICD-10-CM

## 2014-12-24 DIAGNOSIS — R42 Dizziness and giddiness: Secondary | ICD-10-CM

## 2014-12-24 DIAGNOSIS — R278 Other lack of coordination: Secondary | ICD-10-CM

## 2014-12-24 DIAGNOSIS — R110 Nausea: Secondary | ICD-10-CM

## 2014-12-24 DIAGNOSIS — R519 Headache, unspecified: Secondary | ICD-10-CM

## 2014-12-24 LAB — BUN: BUN: 13 mg/dL (ref 6–23)

## 2014-12-24 LAB — CREATININE, SERUM: Creat: 0.66 mg/dL (ref 0.50–1.10)

## 2014-12-24 NOTE — Progress Notes (Signed)
[facility] Clinic Note - Initial Visit   Date: 12/24/2014  Stephanie Best MRN: [id number] DOB: 21-Oct-1961   Dear Dr. Redmond School:   Thank you for your kind referral of Stephanie Best for consultation of dizziness. Although her history is well known to you, please allow [place] to reiterate it for the purpose of our medical record. The patient was accompanied to the clinic by self.    History of Present Illness: Stephanie Best is a 54 y.o. right-handed African American female with hypertension presenting for evaluation of dizziness.    Starting about 2010, she developed episodic dizziness which was initially attributed to a viral infection.  Symptoms recurred in November and December 2015 which lasted a few days and resolved.  She went to see ENT whose evaluation was normal.  She has tried meclizine 25mg , but there was no improvement.  In early 2015, she was sneezing and suddenly developed dizziness, described as spinning sensation.  Symptoms are worse with head movements and improved with eyes closed.  She endorses nausea, no vomiting.  Symptoms are present most of the day, but not constant.  She reports having numbness over the forehead and mild headache. No changes in vision.  She nearly tripped once at home, but did not fall.  She has not been able to return to work since this started and has not been driving.    No numbness/tingling of the arms or legs, weakness, or facial droop.  Denies double vision, swallowing difficulty, or dysarthria.  Out-side paper records, electronic medical record, and images have been reviewed where available and summarized as:  CT head 09/26/2014:  Negative   Past Medical History  Diagnosis Date  . Asthma   . Hypertension   . Obesity   . Vertigo 03/2014    Past Surgical History  Procedure Laterality Date  . Breast reduction surgery  1998  . Knee arthroscopy  1995,06/2008    LEFT,RIGHT  . Abdominal hysterectomy    .  Shoulder surgery      tumor removal     Medications:  Current Outpatient Prescriptions on File Prior to Visit  Medication Sig Dispense Refill  . diltiazem (TIAZAC) 120 MG 24 hr capsule Take 1 capsule (120 mg total) by mouth daily. 90 capsule 3  . lisinopril-hydrochlorothiazide (PRINZIDE,ZESTORETIC) 20-12.5 MG per tablet Take 1 tablet by mouth daily.    . meclizine (ANTIVERT) 25 MG tablet Take 1 tablet (25 mg total) by mouth 2 (two) times daily as needed for dizziness. 15 tablet 0   No current facility-administered medications on file prior to visit.    Allergies: No Known Allergies  Family History: Family History  Problem Relation Age of Onset  . Hypertension Mother   . Cancer Father 26    Stomach cancer  . Stomach cancer Father 2  . Colon cancer Neg Hx   . Colon polyps Neg Hx   . Rectal cancer Neg Hx     Social History: History   Social History  . Marital Status: Single    Spouse Name: N/A    Number of Children: N/A  . Years of Education: N/A   Occupational History  . Not on file.   Social History Main Topics  . Smoking status: Never Smoker   . Smokeless tobacco: Never Used  . Alcohol Use: 0.6 oz/week    1 Glasses of wine per week  . Drug Use: No  . Sexual Activity: Not Currently    Birth Control/ Protection:  Condom   Other Topics Concern  . Not on file   Social History Narrative   Lives with daughter in a one story home.  Works as a Training and development officer.  Education: college degree, currently in school.      Review of Systems:  CONSTITUTIONAL: No fevers, chills, night sweats, or weight loss.   EYES: No visual changes or eye pain ENT: No hearing changes.  No history of nose bleeds.   RESPIRATORY: No cough, wheezing and shortness of breath.   CARDIOVASCULAR: Negative for chest pain, and palpitations.   GI: Negative for abdominal discomfort, blood in stools or black stools.  No recent change in bowel habits.   GU:  No history of incontinence.   MUSCLOSKELETAL: No  history of joint pain or swelling.  No myalgias.   SKIN: Negative for lesions, rash, and itching.   HEMATOLOGY/ONCOLOGY: Negative for prolonged bleeding, bruising easily, and swollen nodes.  No history of cancer.   ENDOCRINE: Negative for cold or heat intolerance, polydipsia or goiter.   PSYCH:  No depression or anxiety symptoms.   NEURO: As Above.   Vital Signs:  BP 160/100 mmHg  Pulse 83  Ht 5\' 4"  (1.626 m)  Wt 251 lb 2 oz (113.91 kg)  BMI 43.08 kg/m2  SpO2 98%   General Medical Exam:   General:  Well appearing, comfortable.   Eyes/ENT: see cranial nerve examination.   Neck: No masses appreciated.  Full range of motion without tenderness.  No carotid bruits. Respiratory:  Clear to auscultation, good air entry bilaterally.   Cardiac:  Regular rate and rhythm, no murmur.   Extremities:  No deformities, edema, or skin discoloration.  Skin:  No rashes or lesions.  Neurological Exam: MENTAL STATUS including orientation to time, place, person, recent and remote memory, attention span and concentration, language, and fund of knowledge is normal.  Speech is not dysarthric.  CRANIAL NERVES: II:  No visual field defects.  Unremarkable fundi.   III-IV-VI: Pupils equal round and reactive to light. No Horner's sign.  Normal conjugate, extra-ocular eye movements in all directions of gaze.  No nystagmus.  No ptosis.  Head thrust positive.  V:  Normal facial sensation.  Jaw jerk is absent.   VII:  Normal facial symmetry and movements.  Left palmomental reflex. VIII:  Normal hearing.   IX-X:  Normal palatal movement.   XI:  Normal shoulder shrug and head rotation.   XII:  Normal tongue strength and range of motion, no deviation or fasciculation.  MOTOR:  Motor strength is 5/5 throughout. No atrophy, fasciculations or abnormal movements.  No pronator drift.  Tone is normal.    MSRs:  Right                                                                 Left brachioradialis 2+   brachioradialis 2+  biceps 2+  biceps 2+  triceps 2+  triceps 2+  patellar 2+  patellar 2+  ankle jerk 2+  ankle jerk 2+  Hoffman no  Hoffman no  plantar response down  plantar response down   SENSORY:  Normal and symmetric perception of light touch, pinprick, vibration, and proprioception.  Romberg's sign absent.   COORDINATION/GAIT:  Right dymetria with finger to nose testing.  Finger-to-finger  testing intact bilaterally. She is unable to rise from a chair without using arms due to dizziness.  Gait is slow, wide-based, and cautious with arms outstretched.  Did not test stressed or tandem gait due to unsteadiness.    IMPRESSION: Ms. Brodhead is a 54 year-old female presenting for evaluation of acute dizziness triggered by sneezing.  There is no nystagmus however head movements exacerbate symptoms and head thrust is positive.  She also had right sided dysmetria with finger to nose testing.  Given the relative abrupt onset of symptoms with valsalva and no improvement of symptoms, I will obtain imaging of her head and neck.  Need to evaluate for central cause of dizziness, specifically posterior circulation stroke or vascular abnormalities.   She had had negative work-up by ENT.       PLAN/RECOMMENDATIONS:  1.  MRI/A head and MRA neck ASAP 2.  Work excuse given 3.  Stroke warning signs discussed 4.  Return to clinic in 1 months.   The duration of this appointment visit was 40 minutes of face-to-face time with the patient.  Greater than 50% of this time was spent in counseling, explanation of diagnosis, planning of further management, and coordination of care.   Thank you for allowing me to participate in patient's care.  If I can answer any additional questions, I would be pleased to do so.    Sincerely,    Abrian Hanover K. Posey Pronto, DO

## 2014-12-24 NOTE — Patient Instructions (Addendum)
1.  MRI/A brain and MRA neck  2.  Telephone update with results

## 2015-01-07 ENCOUNTER — Ambulatory Visit
Admission: RE | Admit: 2015-01-07 | Discharge: 2015-01-07 | Disposition: A | Discharge status: Home / Self Care | Payer: BLUE CROSS/BLUE SHIELD | Source: Ambulatory Visit | Attending: Neurology | Admitting: Neurology

## 2015-01-07 ENCOUNTER — Telehealth: Payer: Self-pay | Admitting: Neurology

## 2015-01-07 DIAGNOSIS — R51 Headache: Secondary | ICD-10-CM

## 2015-01-07 DIAGNOSIS — R42 Dizziness and giddiness: Secondary | ICD-10-CM

## 2015-01-07 DIAGNOSIS — R278 Other lack of coordination: Secondary | ICD-10-CM

## 2015-01-07 DIAGNOSIS — R519 Headache, unspecified: Secondary | ICD-10-CM

## 2015-01-07 MED ORDER — GADOBENATE DIMEGLUMINE 529 MG/ML IV SOLN
20.0000 mL | Freq: Once | INTRAVENOUS | Status: AC | PRN
  Administered 2015-01-07: 20 mL via INTRAVENOUS

## 2015-01-07 NOTE — Telephone Encounter (Signed)
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  No abnormalities of the posterior fossa to explain her dizziness.  She says that dizziness is episodic and improved with meclizine but is interested in doing vestibular therapy.  We will also send referral to neurosurgery to see if she needs additional testing for the aneurysm.  Gailyn Crook K. Posey Pronto, DO

## 2015-01-10 ENCOUNTER — Other Ambulatory Visit: Payer: Self-pay | Admitting: *Deleted

## 2015-01-10 DIAGNOSIS — I729 Aneurysm of unspecified site: Secondary | ICD-10-CM

## 2015-01-10 NOTE — Telephone Encounter (Signed)
Order placed for vestibular therapy and referral faxed to neurosurgery.

## 2015-01-11 ENCOUNTER — Telehealth: Payer: Self-pay | Admitting: Neurology

## 2015-01-11 NOTE — Telephone Encounter (Signed)
Pt has some question about referral please call [phone]

## 2015-01-11 NOTE — Telephone Encounter (Signed)
We will need to f/u with [name] to see if the referral was sent.  Donika K. Posey Pronto, DO

## 2015-01-11 NOTE — Telephone Encounter (Signed)
Patient was calling to see if you had a chance to speak with the neuro surg.per your conversation after discussing her MRI results please advise

## 2015-01-12 NOTE — Telephone Encounter (Signed)
I spoke with patient and informed her that I did send the referral to neurosurgery.  I asked her to call me back if she does not hear from them today.

## 2015-01-12 NOTE — Telephone Encounter (Signed)
Pt wants to know the status of the Neurosurgery referral please call [phone] she states that she has not heard from their office

## 2015-01-13 NOTE — Telephone Encounter (Signed)
I received appointment information from neurosurgery.  Patient has been notified by their clinic.

## 2015-01-13 NOTE — Telephone Encounter (Signed)
Returning your call. °

## 2015-01-14 DIAGNOSIS — Z0279 Encounter for issue of other medical certificate: Secondary | ICD-10-CM

## 2015-01-14 NOTE — Telephone Encounter (Signed)
Called patient back to get dates for disability form.  Patient notified forms are ready.

## 2015-01-21 ENCOUNTER — Encounter: Payer: Self-pay | Admitting: Rehabilitative and Restorative Service Providers"

## 2015-01-21 ENCOUNTER — Ambulatory Visit: Payer: BLUE CROSS/BLUE SHIELD | Attending: Neurology | Admitting: Rehabilitative and Restorative Service Providers"

## 2015-01-21 DIAGNOSIS — I729 Aneurysm of unspecified site: Secondary | ICD-10-CM | POA: Diagnosis not present

## 2015-01-21 DIAGNOSIS — R42 Dizziness and giddiness: Secondary | ICD-10-CM | POA: Diagnosis present

## 2015-01-21 NOTE — Therapy (Signed)
[facility] [address] [place], [place], [address] Phone: [phone]   Fax:  [phone]  Physical Therapy Evaluation  Patient Details  Name: Stephanie Best MRN: [id number] Date of Birth: 1961/11/22 Referring Provider:  Denita Lung, MD  Encounter Date: 01/21/2015      PT End of Session - 01/21/15 1419    Visit Number 1   Number of Visits 4   PT Start Time 1020   PT Stop Time 1103   PT Time Calculation (min) 43 min   Activity Tolerance Patient tolerated treatment well   Behavior During Therapy [facility] for tasks assessed/performed      Past Medical History  Diagnosis Date  . Asthma   . Hypertension   . Obesity   . Vertigo 03/2014    Past Surgical History  Procedure Laterality Date  . Breast reduction surgery  1998  . Knee arthroscopy  1995,06/2008    LEFT,RIGHT  . Abdominal hysterectomy    . Shoulder surgery      tumor removal    There were no vitals taken for this visit.  Visit Diagnosis:  Dizziness and giddiness      Subjective Assessment - 01/21/15 1030    Symptoms The patient reports sudden onset of dizziness in April 2015 that is described as room movement that was constant in nature "like I was drunk".  The patient was seen in ER and dx with vertigo.  She reports multiple episodic issues of this same description (occurring in 09/2014, 10/2014, and 12/2014).  Each time she noted nausea/vomitting, room spinning, "heated sensation", occasional headaches.  Symptoms last 1-2 days and she notes occasional episodes of visual aura prior to headaches and/or dizziness.  MRI revealed a 3-55mm aneurysm and she saw neurosurgeon for that dx yesterday and reports he does not feel her symptoms are from aneurysm.  She also saw ENT and underwent VNG with normal results per patient report.  She is now experiencing dizziness that is more constant in nature, with intermitten episodes of room spinning..  She reports not  always associated with headache.     Patient Stated Goals Decrease dizziness, improve balance.   Currently in Pain? No/denies  Does have intermittent episodes of shooting neck pain   Multiple Pain Sites No          [facility] PT Assessment - 01/21/15 1040    Assessment   Medical Diagnosis vertigo, aneurysm   Balance Screen   Has the patient fallen in the past 6 months No   Has the patient had a decrease in activity level because of a fear of falling?  No   Is the patient reluctant to leave their home because of a fear of falling?  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 Assessment - 01/21/15 1041    Type of Dizziness Spinning  visual spotting as aura before migraine   Frequency of Dizziness --  daily, can be intense or mild   Duration of Dizziness --  low intensity constant, with higher intensities x 4  min   Aggravating Factors No known aggravating factors   Relieving Factors Closing eyes   Occulomotor Alignment Normal   Spontaneous Absent   Gaze-induced Absent   Smooth Pursuits Intact   Comment --  reports having to sit on edge of bed before getting up   VOR 1 Head Only (x 1 viewing) --  10x horiz provokes 5/10 + visual blurring    Comments --  return to sit provokes lightheaded sensation from both sides   Dix-Hallpike Dix-Hallpike Right;Dix-Hallpike Left   Sidelying Test Sidelying Right;Sidelying Left   Horizontal Canal Testing Horizontal Canal Right;Horizontal Canal Left   Sidelying Right Symptoms No nystagmus   Sidelying Left Symptoms No nystagmus  mild general dizzy (no spinning) sensation    Horizontal Canal Right Symptoms Normal  with  lightheadedness   Horizontal Canal Left Symptoms Normal  with lightheadedness     Patient c/o neck tightness and tension.     NEUROMUSCULAR RE-EDUCATION: HEP provided for gaze, cervical rotation and tension stretches.      PT Education - 01/21/15 1418    Education provided Yes   Education Details HEP: gaze x 1 horizontal + vertical,seated, shoulder circles, A/ROM cervical rotation   Person(s) Educated Patient   Methods Explanation;Demonstration;Handout   Comprehension Verbalized understanding;Returned demonstration             PT Long Term Goals - 01/21/15 1642    PT LONG TERM GOAL #1   Title The patient will be indep with HEP for gaze adaptation, habituation.  Target date 02/19/2015.   Time 4   Period Weeks   PT LONG TERM GOAL #2   Title The patient will improve gaze adaptation x 1 viewing x 30 seconds with symptoms < or equal to 3/10.  TArget date 02/19/2015.   Baseline Currently has 5/10 with 10 reps   Time 4   Period Weeks   PT LONG TERM GOAL #3   Title The patient will be further assessed on balance and goal to follow, if indicated.  Target date 02/19/2015.   Time 4   Period Weeks               Plan - 01/21/15 1638    Clinical Impression Statement The patient is a 54 yo female presenting with sensitivity to movements including gaze x 1 viewing (also notes visual blurring) indicating decreased use of VOR and sensitivity to horizontal head movements.  She also reports neck tension.  PT provided gaze x 1 HEP and neck tension stretches and plans to f/u in 10-14 days to check on progress.  She has significant h/o migraine with visual aura per subjective report that may be a contributing factor to current presentation.  PT to progress to patient tolerance.   Pt will benefit from skilled therapeutic intervention in order to improve on the following deficits Decreased mobility;Abnormal gait;Difficulty walking;Decreased activity tolerance;Decreased balance;Other (comment)   vertigo   Rehab Potential Good   PT Frequency 1x / week   PT Duration 4 weeks   PT Treatment/Interventions Functional mobility training;Therapeutic activities;Patient/family education;Therapeutic exercise;Balance training;Neuromuscular re-education;Gait training;Manual techniques;Other (comment)  vestibular rehab   PT Next Visit Plan Check HEP, discuss progress/symtpoms, modify plan as needed.   Consulted and Agree with Plan of Care Patient         Problem List Patient Active Problem List   Diagnosis Date Noted  . Dizziness 12/24/2014  . Personal history of colonic polyps 04/09/2014  . Hypertension 05/15/2011  . Asthma 05/15/2011  .  Obesity    Thank you for the referral of this patient.   [place], PT 01/21/2015, 4:44 PM  [facility] [address] [place], [place], [address] Phone: [phone]   Fax:  [phone]

## 2015-01-21 NOTE — Patient Instructions (Addendum)
Gaze Stabilization: Tip Card 1.Target must remain in focus, not blurry, and appear stationary while head is in motion. 2.Perform exercises with small head movements (45 to either side of midline). 3.Increase speed of head motion so long as target is in focus. 4.If you wear eyeglasses, be sure you can see target through lens (therapist will give specific instructions for bifocal / progressive lenses). 5.These exercises may provoke dizziness or nausea. Work through these symptoms. If too dizzy, slow head movement slightly. Rest between each exercise. 6.Exercises demand concentration; avoid distractions. 7.For safety, perform standing exercises close to a counter, wall, corner, or next to someone.  Copyright  VHI. All rights reserved.  Gaze Stabilization: Sitting   Keeping eyes on target on wall 3 feet away, tilt head down slightly and move head side to side for __20_ times. Repeat while moving head up and down for __10_ times. Do _2-3___ sessions per day. Repeat using target on pattern background.  Copyright  VHI. All rights reserved.  Active Neck Rotation   With head in a comfortable position and chin gently tucked in, rotate head to the right. Hold __3__ seconds. Repeat to the left within a pain free range. Repeat _5___ times. Do __1-2__ sessions per day.  http://gt2.exer.us/11   Copyright  VHI. All rights reserved.  Shoulder Circle Shrug   Bring shoulders up and rotate around backward. Repeat __10__ times. Do __2__ sessions per day.  http://gt2.exer.us/15   Copyright  VHI. All rights reserved.  Special Instructions: Exercises may bring on mild to moderate symptoms of headache, dizziness that resolve within 30 minutes of completing exercises. If symptoms are lasting longer than 30 minutes, modify your exercises by:  >decreasing the # of times you complete each activity >ensuring your symptoms return to baseline before moving onto the next exercise >dividing up exercises  so you do not do them all in one session, but multiple short sessions throughout the day >doing them once a day until symptoms improve

## 2015-01-27 ENCOUNTER — Ambulatory Visit: Payer: Medicaid Other | Admitting: Neurology

## 2015-01-31 ENCOUNTER — Other Ambulatory Visit (HOSPITAL_COMMUNITY): Payer: Self-pay | Admitting: Neurosurgery

## 2015-01-31 ENCOUNTER — Telehealth: Payer: Self-pay | Admitting: Family Medicine

## 2015-01-31 DIAGNOSIS — I671 Cerebral aneurysm, nonruptured: Secondary | ICD-10-CM

## 2015-01-31 NOTE — Telephone Encounter (Signed)
Received form and records request for records to be sent to [facility]. Records requested fax over to [phone].

## 2015-02-11 ENCOUNTER — Ambulatory Visit: Payer: BLUE CROSS/BLUE SHIELD | Attending: Neurology | Admitting: Rehabilitative and Restorative Service Providers"

## 2015-02-11 DIAGNOSIS — R42 Dizziness and giddiness: Secondary | ICD-10-CM | POA: Insufficient documentation

## 2015-02-11 DIAGNOSIS — I729 Aneurysm of unspecified site: Secondary | ICD-10-CM | POA: Insufficient documentation

## 2015-02-11 NOTE — Therapy (Signed)
[facility] [address] [place], [place], [address] Phone: [phone]   Fax:  [phone]  Physical Therapy Treatment  Patient Details  Name: Stephanie Best MRN: [id number] Date of Birth: 01-Dec-1961 Referring Provider:  Denita Lung, MD  Encounter Date: 02/11/2015      PT End of Session - 02/11/15 1530    Visit Number 2   Number of Visits 4   PT Start Time 1320   PT Stop Time 1400   PT Time Calculation (min) 40 min   Activity Tolerance Patient tolerated treatment well   Behavior During Therapy [facility] for tasks assessed/performed      Past Medical History  Diagnosis Date  . Asthma   . Hypertension   . Obesity   . Vertigo 03/2014    Past Surgical History  Procedure Laterality Date  . Breast reduction surgery  1998  . Knee arthroscopy  1995,06/2008    LEFT,RIGHT  . Abdominal hysterectomy    . Shoulder surgery      tumor removal    There were no vitals taken for this visit.  Visit Diagnosis:  Dizziness and giddiness      Subjective Assessment - 02/11/15 1324    Symptoms The patient is still getting dizziness 1-2x/day that are now lasting for seconds. She had one intense episode where she fell backwards into the chair.     Currently in Pain? No/denies    Symptoms are not as bad as they were, but still present.  Severe episodes not as frequent.  NEUROMUSCULAR RE-EDUCATION: Reviewed gaze x 1 viewing seated horizontal and vertical planes Transitioned exercise to standing with recommendation to have UE support Standing corner balance exercises with head turns,and standing with eyes closed. Habituation sit<>bilateral sidelying x 3 repetitions Home walking program discussed            Vestibular Assessment - 02/11/15 1355    Orthostatics   BP supine (x 5 minutes) 120/75 mmHg   HR supine (x 5 minutes) 85   BP standing (after 1 minute) 126/89 mmHg   HR standing (after 1 minute) 95   BP  standing (after 3 minutes) 116/87 mmHg   HR standing (after 3 minutes) 93                      PT Education - 02/11/15 1530    Education Details HEP: gaze x 1 standing, standing head turns horizontal, standing eyes closed, habituation sit<>sidelying   Person(s) Educated Patient   Methods Explanation;Demonstration;Handout   Comprehension Returned demonstration;Verbalized understanding             PT Long Term Goals - 01/21/15 1642    PT LONG TERM GOAL #1   Title The patient will be indep with HEP for gaze adaptation, habituation.  Target date 02/19/2015.   Time 4   Period Weeks   PT LONG TERM GOAL #2   Title The patient will improve gaze adaptation x 1 viewing x 30 seconds with symptoms < or equal to 3/10.  TArget date 02/19/2015.   Baseline Currently has 5/10 with 10 reps   Time 4   Period Weeks   PT LONG TERM GOAL #3   Title The patient will be further assessed on balance and goal to follow, if indicated.  Target date 02/19/2015.   Time 4   Period Weeks               Plan - 02/11/15 1531  Clinical Impression Statement The patient continues to have symptoms provoked with gaze activities and balance activities in standing.  At today's visit, R sidelying provokes symtpoms.  Orthostatic measurements fof BP did not reveal hypotension.    PT Next Visit Plan Check HEP, discuss progress/symtpoms, modify plan as needed.        Problem List Patient Active Problem List   Diagnosis Date Noted  . Dizziness 12/24/2014  . Personal history of colonic polyps 04/09/2014  . Hypertension 05/15/2011  . Asthma 05/15/2011  . Obesity     Mckaylee Dimalanta, PT 02/11/2015, 3:32 PM  [facility] [address] [place], [place], [address] Phone: [phone]   Fax:  [phone]

## 2015-02-11 NOTE — Patient Instructions (Signed)
Gaze Stabilization: Tip Card 1.Target must remain in focus, not blurry, and appear stationary while head is in motion. 2.Perform exercises with small head movements (45 to either side of midline). 3.Increase speed of head motion so long as target is in focus. 4.If you wear eyeglasses, be sure you can see target through lens (therapist will give specific instructions for bifocal / progressive lenses). 5.These exercises may provoke dizziness or nausea. Work through these symptoms. If too dizzy, slow head movement slightly. Rest between each exercise. 6.Exercises demand concentration; avoid distractions. 7.For safety, perform standing exercises close to a counter, wall, corner, or next to someone.  Copyright  VHI. All rights reserved.  Gaze Stabilization: Standing Feet Apart   Feet shoulder width apart, keeping eyes on target on wall __3__ feet away and move head side to side for _30_ seconds. Repeat while moving head up and down for _30___ seconds. Do __2__ sessions per day. Repeat using target on pattern background.  Copyright  VHI. All rights reserved.  Feet Apart, Head Motion - Eyes Open   With eyes open, feet apart, move head slowly: side to side. Repeat __5__ times per session. Do ___2_ sessions per day.  Copyright  VHI. All rights reserved.  Feet Apart, Varied Arm Positions - Eyes Closed   Stand with feet shoulder width apart and arms at your side. Close eyes and visualize upright position. Hold _10___ seconds. Repeat _5___ times per session. Do __2__ sessions per day.  Copyright  VHI. All rights reserved.   Sit to Side-Lying   Sit on edge of bed. 1. Turn head 45 to right. 2. Maintain head position and lie down slowly on left side. Hold until symptoms stop. 3. Sit up slowly. Hold until symptoms stop. 4. Turn head 45 to left. 5. Maintain head position and lie down slowly on right side. Hold until symptoms subside. 6. Sit up slowly. Repeat sequence __3__ times per  session. Do __2__ sessions per day.  Copyright  VHI. All rights reserved.    Walking Program:  Begin walking for exercise for 10 minutes, 1 times/day, 4 days/week.   Progress your walking program by adding 2 minutes to your routine each week, as tolerated. Be sure to wear good walking shoes, walk in a safe environment and only progress to your tolerance.

## 2015-02-21 ENCOUNTER — Encounter (HOSPITAL_COMMUNITY): Payer: Self-pay | Admitting: Pharmacy Technician

## 2015-02-24 ENCOUNTER — Ambulatory Visit (HOSPITAL_COMMUNITY)
Admission: RE | Admit: 2015-02-24 | Discharge: 2015-02-24 | Disposition: A | Discharge status: Home / Self Care | Payer: BLUE CROSS/BLUE SHIELD | Source: Ambulatory Visit | Attending: Neurosurgery | Admitting: Neurosurgery

## 2015-02-24 ENCOUNTER — Other Ambulatory Visit (HOSPITAL_COMMUNITY): Payer: Self-pay | Admitting: Neurosurgery

## 2015-02-24 DIAGNOSIS — I671 Cerebral aneurysm, nonruptured: Secondary | ICD-10-CM

## 2015-02-24 HISTORY — PX: IR GENERIC HISTORICAL: IMG1180011

## 2015-02-24 LAB — CBC WITH DIFFERENTIAL/PLATELET
Basophils Absolute: 0 10*3/uL (ref 0.0–0.1)
Basophils Relative: 0 % (ref 0–1)
EOS ABS: 0.1 10*3/uL (ref 0.0–0.7)
Eosinophils Relative: 1 % (ref 0–5)
HEMATOCRIT: 39.4 % (ref 36.0–46.0)
HEMOGLOBIN: 13.2 g/dL (ref 12.0–15.0)
LYMPHS ABS: 2.4 10*3/uL (ref 0.7–4.0)
Lymphocytes Relative: 33 % (ref 12–46)
MCH: 27.7 pg (ref 26.0–34.0)
MCHC: 33.5 g/dL (ref 30.0–36.0)
MCV: 82.6 fL (ref 78.0–100.0)
Monocytes Absolute: 0.5 10*3/uL (ref 0.1–1.0)
Monocytes Relative: 7 % (ref 3–12)
Neutro Abs: 4.4 10*3/uL (ref 1.7–7.7)
Neutrophils Relative %: 59 % (ref 43–77)
Platelets: 296 10*3/uL (ref 150–400)
RBC: 4.77 MIL/uL (ref 3.87–5.11)
RDW: 13.9 % (ref 11.5–15.5)
WBC: 7.4 10*3/uL (ref 4.0–10.5)

## 2015-02-24 LAB — BASIC METABOLIC PANEL
ANION GAP: 8 (ref 5–15)
BUN: 11 mg/dL (ref 6–23)
CALCIUM: 9.1 mg/dL (ref 8.4–10.5)
CO2: 25 mmol/L (ref 19–32)
Chloride: 108 mmol/L (ref 96–112)
Creatinine, Ser: 0.82 mg/dL (ref 0.50–1.10)
GFR calc Af Amer: >90 mL/min (ref >90)
GFR calc non Af Amer: 81 mL/min — ABNORMAL LOW (ref >90)
GLUCOSE: 92 mg/dL (ref 70–99)
Potassium: 3.8 mmol/L (ref 3.5–5.1)
Sodium: 141 mmol/L (ref 135–145)

## 2015-02-24 LAB — PROTIME-INR
INR: 0.92 (ref 0.00–1.49)
Prothrombin Time: 12.4 seconds (ref 11.6–15.2)

## 2015-02-24 LAB — APTT: aPTT: 32 seconds (ref 24–37)

## 2015-02-24 MED ORDER — IOHEXOL 300 MG/ML  SOLN
150.0000 mL | Freq: Once | INTRAMUSCULAR | Status: AC | PRN
  Administered 2015-02-24: 80 mL via INTRA_ARTERIAL

## 2015-02-24 MED ORDER — HYDROCODONE-ACETAMINOPHEN 5-325 MG PO TABS
1.0000 | ORAL_TABLET | ORAL | Status: DC | PRN
  Administered 2015-02-24: 1 via ORAL
  Filled 2015-02-24: qty 1

## 2015-02-24 MED ORDER — LIDOCAINE HCL 1 % IJ SOLN
INTRAMUSCULAR | Status: AC
  Filled 2015-02-24: qty 20

## 2015-02-24 MED ORDER — FENTANYL CITRATE 0.05 MG/ML IJ SOLN
INTRAMUSCULAR | Status: AC
  Filled 2015-02-24: qty 2

## 2015-02-24 MED ORDER — HYDRALAZINE HCL 20 MG/ML IJ SOLN
INTRAMUSCULAR | Status: AC | PRN
  Administered 2015-02-24: 10 mg via INTRAVENOUS

## 2015-02-24 MED ORDER — HEPARIN SOD (PORK) LOCK FLUSH 100 UNIT/ML IV SOLN
INTRAVENOUS | Status: AC
  Filled 2015-02-24: qty 30

## 2015-02-24 MED ORDER — MIDAZOLAM HCL 2 MG/2ML IJ SOLN
INTRAMUSCULAR | Status: AC
  Filled 2015-02-24: qty 2

## 2015-02-24 MED ORDER — HYDRALAZINE HCL 20 MG/ML IJ SOLN
INTRAMUSCULAR | Status: AC
  Filled 2015-02-24: qty 1

## 2015-02-24 MED ORDER — MIDAZOLAM HCL 2 MG/2ML IJ SOLN
INTRAMUSCULAR | Status: AC | PRN
  Administered 2015-02-24: 0.5 mg via INTRAVENOUS

## 2015-02-24 MED ORDER — FENTANYL CITRATE 0.05 MG/ML IJ SOLN
INTRAMUSCULAR | Status: AC | PRN
  Administered 2015-02-24: 25 ug via INTRAVENOUS

## 2015-02-24 MED ORDER — SODIUM CHLORIDE 0.9 % IV SOLN: INTRAVENOUS | Status: DC

## 2015-02-24 MED ORDER — HEPARIN SOD (PORK) LOCK FLUSH 100 UNIT/ML IV SOLN
INTRAVENOUS | Status: AC | PRN
  Administered 2015-02-24: 2000 [IU] via INTRAVENOUS

## 2015-02-24 NOTE — Discharge Instructions (Signed)

## 2015-02-24 NOTE — Sedation Documentation (Signed)
Patient is resting comfortably. 

## 2015-02-24 NOTE — Sedation Documentation (Signed)
Pt with eyes closed during procedure, follows commands to hold breath and not move when requested w/ ease. Pt has no complaints at this time.

## 2015-02-24 NOTE — Sedation Documentation (Signed)
Don Broach holding manual pressure for 15-47min after inserting a 5Fr exoseal to Rt groin. Pt tolerating well.

## 2015-02-24 NOTE — Sedation Documentation (Signed)
Pressure tape dressing to rt groin dry and intact, pedal pulses palpable.  No signs of hematoma.

## 2015-02-24 NOTE — Progress Notes (Signed)
PREOP DX: Acom aneurysm  POSTOP DX: Same  PROCEDURE: Diagnostic cerebral angiogram  SURGEON: Dr. Consuella Lose, MD  ANESTHESIA: IV Sedation with Local  EBL: Minimal  SPECIMENS: None  COMPLICATIONS: None  CONDITION: Stable to recovery  FINDINGS: 1. 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 2. No other aneurysms, AVM, or fistulas seen.

## 2015-02-24 NOTE — Sedation Documentation (Signed)
Dr. Kathyrn Sheriff in to s/w pt, answer questions, then pt transferred to [facility].

## 2015-02-24 NOTE — Sedation Documentation (Signed)
Pt following MD's directions well, no c/o pain.

## 2015-02-24 NOTE — H&P (Signed)
CC:  Aneurysm  HPI: Stephanie Best is a 54 y.o. female who was initially seen in the office with incidental discovery of an acom aneurysm after MRI done for a chief complaint of dizziness. She now presents for further workup with diagnostic cerebral angiogram.  PMH: Past Medical History  Diagnosis Date  . Asthma   . Hypertension   . Obesity   . Vertigo 03/2014    PSH: Past Surgical History  Procedure Laterality Date  . Breast reduction surgery  1998  . Knee arthroscopy  1995,06/2008    LEFT,RIGHT  . Abdominal hysterectomy    . Shoulder surgery      tumor removal    SH: History  Substance Use Topics  . Smoking status: Never Smoker   . Smokeless tobacco: Never Used  . Alcohol Use: 0.6 oz/week    1 Glasses of wine per week     Comment: Rarely    MEDS: Prior to Admission medications   Medication Sig Start Date End Date Taking? Authorizing Provider  diltiazem (TIAZAC) 120 MG 24 hr capsule Take 1 capsule (120 mg total) by mouth daily. 04/09/14  Yes Denita Lung, MD  lisinopril-hydrochlorothiazide (PRINZIDE,ZESTORETIC) 20-12.5 MG per tablet Take 1 tablet by mouth daily.   Yes Historical Provider, MD  meclizine (ANTIVERT) 25 MG tablet Take 1 tablet (25 mg total) by mouth 2 (two) times daily as needed for dizziness. 11/10/14  Yes Denita Lung, MD    ALLERGY: No Known Allergies  ROS: ROS  NEUROLOGIC EXAM: Awake, alert, oriented Memory and concentration grossly intact Speech fluent, appropriate CN grossly intact Motor exam: Upper Extremities Deltoid Bicep Tricep Grip  Right 5/5 5/5 5/5 5/5  Left 5/5 5/5 5/5 5/5   Lower Extremity IP Quad PF DF EHL  Right 5/5 5/5 5/5 5/5 5/5  Left 5/5 5/5 5/5 5/5 5/5   Sensation grossly intact to LT  IMGAING: MRA demonstrates a small, ~3-4 mm Acom aneurysm  IMPRESSION: - 54 y.o. female with incidentally discovered Acom aneurysm  PLAN: - Proceed with diagnostic cerebral angiogram. - Likely home post procedure

## 2015-03-01 ENCOUNTER — Ambulatory Visit: Payer: BLUE CROSS/BLUE SHIELD | Admitting: Rehabilitative and Restorative Service Providers"

## 2015-03-04 ENCOUNTER — Encounter (HOSPITAL_COMMUNITY): Payer: Self-pay

## 2015-03-04 ENCOUNTER — Emergency Department (INDEPENDENT_AMBULATORY_CARE_PROVIDER_SITE_OTHER)
Admission: EM | Admit: 2015-03-04 | Discharge: 2015-03-04 | Disposition: A | Discharge status: Home / Self Care | Payer: Self-pay | Source: Home / Self Care | Attending: Family Medicine | Admitting: Family Medicine

## 2015-03-04 DIAGNOSIS — B349 Viral infection, unspecified: Secondary | ICD-10-CM

## 2015-03-04 DIAGNOSIS — J4530 Mild persistent asthma, uncomplicated: Secondary | ICD-10-CM

## 2015-03-04 DIAGNOSIS — J029 Acute pharyngitis, unspecified: Secondary | ICD-10-CM

## 2015-03-04 LAB — POCT RAPID STREP A: Streptococcus, Group A Screen (Direct): NEGATIVE

## 2015-03-04 MED ORDER — TRIAMCINOLONE ACETONIDE 40 MG/ML IJ SUSP
INTRAMUSCULAR | Status: AC
  Filled 2015-03-04: qty 1

## 2015-03-04 MED ORDER — TRIAMCINOLONE ACETONIDE 40 MG/ML IJ SUSP
40.0000 mg | Freq: Once | INTRAMUSCULAR | Status: AC
  Administered 2015-03-04: 40 mg via INTRAMUSCULAR

## 2015-03-04 MED ORDER — IPRATROPIUM-ALBUTEROL 0.5-2.5 (3) MG/3ML IN SOLN
3.0000 mL | Freq: Once | RESPIRATORY_TRACT | Status: AC
  Administered 2015-03-04: 3 mL via RESPIRATORY_TRACT

## 2015-03-04 MED ORDER — ALBUTEROL SULFATE (2.5 MG/3ML) 0.083% IN NEBU
2.5000 mg | INHALATION_SOLUTION | Freq: Once | RESPIRATORY_TRACT | Status: AC
  Administered 2015-03-04: 2.5 mg via RESPIRATORY_TRACT

## 2015-03-04 MED ORDER — IPRATROPIUM-ALBUTEROL 0.5-2.5 (3) MG/3ML IN SOLN
RESPIRATORY_TRACT | Status: AC
  Filled 2015-03-04: qty 3

## 2015-03-04 MED ORDER — PREDNISONE 20 MG PO TABS: ORAL_TABLET | ORAL | Status: DC

## 2015-03-04 MED ORDER — ALBUTEROL SULFATE HFA 108 (90 BASE) MCG/ACT IN AERS: 2.0000 | INHALATION_SPRAY | RESPIRATORY_TRACT | Status: DC | PRN

## 2015-03-04 NOTE — ED Provider Notes (Signed)
CSN: [id number]     Arrival date & time 03/04/15  1556 History   First MD Initiated Contact with Patient 03/04/15 1715     Chief Complaint  Patient presents with  . Sore Throat   (Consider location/radiation/quality/duration/timing/severity/associated sxs/prior Treatment) HPI Comments: 54 year old obese female complaining of a sore throat and cough for the past several days. She states she has been feeling poorly for about one week. One week ago she had elevated temperatures, fatigue and malaise. The past 2 nights she has been having cough spasms. She also is having laryngitis. Denies sniffles or runny nose. She is not a smoker.  Patient is a 54 y.o. female presenting with pharyngitis.  Sore Throat Pertinent negatives include no shortness of breath.    Past Medical History  Diagnosis Date  . Asthma   . Hypertension   . Obesity   . Vertigo 03/2014   Past Surgical History  Procedure Laterality Date  . Breast reduction surgery  1998  . Knee arthroscopy  1995,06/2008    LEFT,RIGHT  . Abdominal hysterectomy    . Shoulder surgery      tumor removal   Family History  Problem Relation Age of Onset  . Hypertension Mother   . Cancer Father 40    Stomach cancer  . Stomach cancer Father 75  . Colon cancer Neg Hx   . Colon polyps Neg Hx   . Rectal cancer Neg Hx   . Other Son     Killed, 32  . Healthy Daughter     45   History  Substance Use Topics  . Smoking status: Never Smoker   . Smokeless tobacco: Never Used  . Alcohol Use: 0.6 oz/week    1 Glasses of wine per week     Comment: Rarely   OB History    No data available     Review of Systems  Constitutional: Positive for fever, activity change, appetite change and fatigue.  HENT: Positive for congestion and sore throat. Negative for postnasal drip, rhinorrhea and sinus pressure.   Eyes: Negative.   Respiratory: Positive for cough. Negative for chest tightness and shortness of breath.   Cardiovascular: Negative.    Gastrointestinal: Negative.   Genitourinary: Negative.   Musculoskeletal: Negative.   Skin: Negative.     Allergies  Review of patient's allergies indicates no known allergies.  Home Medications   Prior to Admission medications   Medication Sig Start Date End Date Taking? Authorizing Provider  albuterol (PROVENTIL HFA;VENTOLIN HFA) 108 (90 BASE) MCG/ACT inhaler Inhale 2 puffs into the lungs every 4 (four) hours as needed for wheezing or shortness of breath. 03/04/15   Janne Napoleon, NP  diltiazem (TIAZAC) 120 MG 24 hr capsule Take 1 capsule (120 mg total) by mouth daily. 04/09/14   Denita Lung, MD  lisinopril-hydrochlorothiazide (PRINZIDE,ZESTORETIC) 20-12.5 MG per tablet Take 1 tablet by mouth daily.    Historical Provider, MD  meclizine (ANTIVERT) 25 MG tablet Take 1 tablet (25 mg total) by mouth 2 (two) times daily as needed for dizziness. 11/10/14   Denita Lung, MD  predniSONE (DELTASONE) 20 MG tablet Take 3 tabs po on first day, 2 tabs second day, 2 tabs third day, 1 tab fourth day, 1 tab 5th day. Take with food. 03/04/15   Janne Napoleon, NP   BP 141/89 mmHg  Pulse 92  Temp(Src) 99.2 F (37.3 C) (Oral)  Resp 20  SpO2 95% Physical Exam  Constitutional: She is oriented to person, place, and  time. She appears well-developed and well-nourished. No distress.  HENT:  Bilateral TMs are translucent and without erythema. Both with mild retraction. Oropharynx with mild/moderate erythema. No exudates.  Eyes: Conjunctivae and EOM are normal.  Neck: Normal range of motion. Neck supple.  Cardiovascular: Normal rate, regular rhythm and normal heart sounds.   Pulmonary/Chest: Effort normal. No respiratory distress. She has no rales.  Tight wheezes bilaterally with forced expiration. Coarseness with forced cough.  Lymphadenopathy:    She has no cervical adenopathy.  Neurological: She is alert and oriented to person, place, and time. She exhibits normal muscle tone.  Skin: Skin is warm and dry.   Nursing note and vitals reviewed.   ED Course  Procedures (including critical care time) Labs Review Labs Reviewed  POCT RAPID STREP A ([place])   Results for orders placed or performed during the hospital encounter of 03/04/15  POCT rapid strep A [facility])  Result Value Ref Range   Streptococcus, Group A Screen (Direct) NEGATIVE NEGATIVE    Imaging Review No results found.   MDM   1. Viral syndrome   2. Viral pharyngitis   3. RAD (reactive airway disease) with wheezing, mild persistent, uncomplicated    Post albuterol 5 mg/2.5 mg patient states that she is breathing little better and her cough has decreased. Auscultation reveals much improved air movement and no wheezing. Albuterol HFA as directed Kenalog 40 mg IM now Start tomorrow, prednisone taper dose May use Coricidin, or Zyrtec or  Claritin or Allegra for drainage Tylenol for fever or sore throat Follow-up with your PCP. Bradycardia worsening symptoms or problems may return.    Janne Napoleon, NP 03/04/15 1820

## 2015-03-04 NOTE — ED Notes (Signed)
Patient complains of sore throat, cough and high fever that started last Saturday Has tried OTC  Remedies with no relief

## 2015-03-04 NOTE — Discharge Instructions (Signed)
Bronchospasm A bronchospasm is when the tubes that carry air in and out of your lungs (airways) spasm or tighten. During a bronchospasm it is hard to breathe. This is because the airways get smaller. A bronchospasm can be triggered by:  Allergies. These may be to animals, pollen, food, or mold.  Infection. This is a common cause of bronchospasm.  Exercise.  Irritants. These include pollution, cigarette smoke, strong odors, aerosol sprays, and paint fumes.  Weather changes.  Stress.  Being emotional. HOME CARE   Always have a plan for getting help. Know when to call your doctor and local emergency services (911 in the [place] Know where you can get emergency care.  Only take medicines as told by your doctor.  If you were prescribed an inhaler or nebulizer machine, ask your doctor how to use it correctly. Always use a spacer with your inhaler if you were given one.  Stay calm during an attack. Try to relax and breathe more slowly.  Control your home environment:  Change your heating and air conditioning filter at least once a month.  Limit your use of fireplaces and wood stoves.  Do not  smoke. Do not  allow smoking in your home.  Avoid perfumes and fragrances.  Get rid of pests (such as roaches and mice) and their droppings.  Throw away plants if you see mold on them.  Keep your house clean and dust free.  Replace carpet with wood, tile, or vinyl flooring. Carpet can trap dander and dust.  Use allergy-proof pillows, mattress covers, and box spring covers.  Wash bed sheets and blankets every week in hot water. Dry them in a dryer.  Use blankets that are made of polyester or cotton.  Wash hands frequently. GET HELP IF:  You have muscle aches.  You have chest pain.  The thick spit you spit or cough up (sputum) changes from clear or white to yellow, green, gray, or bloody.  The thick spit you spit or cough up gets thicker.  There are problems that may be related  to the medicine you are given such as:  A rash.  Itching.  Swelling.  Trouble breathing. GET HELP RIGHT AWAY IF:  You feel you cannot breathe or catch your breath.  You cannot stop coughing.  Your treatment is not helping you breathe better.  You have very bad chest pain. MAKE SURE YOU:   Understand these instructions.  Will watch your condition.  Will get help right away if you are not doing well or get worse. Document Released: 09/23/2009 Document Revised: 12/01/2013 Document Reviewed: 05/19/2013 [facility] Patient Information 2015 [place], [place]. This information is not intended to replace advice given to you by your health care provider. Make sure you discuss any questions you have with your health care provider.  How to Use an Inhaler Using your inhaler correctly is very important. Good technique will make sure that the medicine reaches your lungs.  HOW TO USE AN INHALER:  Take the cap off the inhaler.  If this is the first time using your inhaler, you need to prime it. Shake the inhaler for 5 seconds. Release four puffs into the air, away from your face. Ask your doctor for help if you have questions.  Shake the inhaler for 5 seconds.  Turn the inhaler so the bottle is above the mouthpiece.  Put your pointer finger on top of the bottle. Your thumb holds the bottom of the inhaler.  Open your mouth.  Either hold  the inhaler away from your mouth (the width of 2 fingers) or place your lips tightly around the mouthpiece. Ask your doctor which way to use your inhaler.  Breathe out as much air as possible.  Breathe in and push down on the bottle 1 time to release the medicine. You will feel the medicine go in your mouth and throat.  Continue to take a deep breath in very slowly. Try to fill your lungs.  After you have breathed in completely, hold your breath for 10 seconds. This will help the medicine to settle in your lungs. If you cannot hold your breath for 10  seconds, hold it for as long as you can before you breathe out.  Breathe out slowly, through pursed lips. Whistling is an example of pursed lips.  If your doctor has told you to take more than 1 puff, wait at least 15-30 seconds between puffs. This will help you get the best results from your medicine. Do not use the inhaler more than your doctor tells you to.  Put the cap back on the inhaler.  Follow the directions from your doctor or from the inhaler package about cleaning the inhaler. If you use more than one inhaler, ask your doctor which inhalers to use and what order to use them in. Ask your doctor to help you figure out when you will need to refill your inhaler.  If you use a steroid inhaler, always rinse your mouth with water after your last puff, gargle and spit out the water. Do not swallow the water. GET HELP IF:  The inhaler medicine only partially helps to stop wheezing or shortness of breath.  You are having trouble using your inhaler.  You have some increase in thick spit (phlegm). GET HELP RIGHT AWAY IF:  The inhaler medicine does not help your wheezing or shortness of breath or you have tightness in your chest.  You have dizziness, headaches, or fast heart rate.  You have chills, fever, or night sweats.  You have a large increase of thick spit, or your thick spit is bloody. MAKE SURE YOU:   Understand these instructions.  Will watch your condition.  Will get help right away if you are not doing well or get worse. Document Released: 09/04/2008 Document Revised: 09/16/2013 Document Reviewed: 06/25/2013 [facility] Patient Information 2015 [place], [place]. This information is not intended to replace advice given to you by your health care provider. Make sure you discuss any questions you have with your health care provider.  Sore Throat A sore throat is pain, burning, irritation, or scratchiness of the throat. There is often pain or tenderness when swallowing or  talking. A sore throat may be accompanied by other symptoms, such as coughing, sneezing, fever, and swollen neck glands. A sore throat is often the first sign of another sickness, such as a cold, flu, strep throat, or mononucleosis (commonly known as mono). Most sore throats go away without medical treatment. CAUSES  The most common causes of a sore throat include:  A viral infection, such as a cold, flu, or mono.  A bacterial infection, such as strep throat, tonsillitis, or whooping cough.  Seasonal allergies.  Dryness in the air.  Irritants, such as smoke or pollution.  Gastroesophageal reflux disease (GERD). HOME CARE INSTRUCTIONS   Only take over-the-counter medicines as directed by your caregiver.  Drink enough fluids to keep your urine clear or pale yellow.  Rest as needed.  Try using throat sprays, lozenges, or sucking on  hard candy to ease any pain (if older than 4 years or as directed).  Sip warm liquids, such as broth, herbal tea, or warm water with honey to relieve pain temporarily. You may also eat or drink cold or frozen liquids such as frozen ice pops.  Gargle with salt water (mix 1 tsp salt with 8 oz of water).  Do not smoke and avoid secondhand smoke.  Put a cool-mist humidifier in your bedroom at night to moisten the air. You can also turn on a hot shower and sit in the bathroom with the door closed for 5-10 minutes. SEEK IMMEDIATE MEDICAL CARE IF:  You have difficulty breathing.  You are unable to swallow fluids, soft foods, or your saliva.  You have increased swelling in the throat.  Your sore throat does not get better in 7 days.  You have nausea and vomiting.  You have a fever or persistent symptoms for more than 2-3 days.  You have a fever and your symptoms suddenly get worse. MAKE SURE YOU:   Understand these instructions.  Will watch your condition.  Will get help right away if you are not doing well or get worse. Document Released:  01/03/2005 Document Revised: 11/12/2012 Document Reviewed: 08/03/2012 [facility] Patient Information 2015 [place], [place]. This information is not intended to replace advice given to you by your health care provider. Make sure you discuss any questions you have with your health care provider.  Viral Infections A virus is a type of germ. Viruses can cause:  Minor sore throats.  Aches and pains.  Headaches.  Runny nose.  Rashes.  Watery eyes.  Tiredness.  Coughs.  Loss of appetite.  Feeling sick to your stomach (nausea).  Throwing up (vomiting).  Watery poop (diarrhea). HOME CARE   Only take medicines as told by your doctor.  Drink enough water and fluids to keep your pee (urine) clear or pale yellow. Sports drinks are a good choice.  Get plenty of rest and eat healthy. Soups and broths with crackers or rice are fine. GET HELP RIGHT AWAY IF:   You have a very bad headache.  You have shortness of breath.  You have chest pain or neck pain.  You have an unusual rash.  You cannot stop throwing up.  You have watery poop that does not stop.  You cannot keep fluids down.  You or your child has a temperature by mouth above 102 F (38.9 C), not controlled by medicine.  Your baby is older than 3 months with a rectal temperature of 102 F (38.9 C) or higher.  Your baby is 87 months old or younger with a rectal temperature of 100.4 F (38 C) or higher. MAKE SURE YOU:   Understand these instructions.  Will watch this condition.  Will get help right away if you are not doing well or get worse. Document Released: 11/08/2008 Document Revised: 02/18/2012 Document Reviewed: 04/03/2011 [facility] Patient Information 2015 [place], [place]. This information is not intended to replace advice given to you by your health care provider. Make sure you discuss any questions you have with your health care provider.

## 2015-03-07 LAB — CULTURE, GROUP A STREP: Strep A Culture: NEGATIVE

## 2015-04-27 ENCOUNTER — Telehealth: Payer: Self-pay | Admitting: Internal Medicine

## 2015-04-27 NOTE — Telephone Encounter (Signed)
Faxed over medical records to [facility] on [phone] on 04/18/15

## 2015-05-25 ENCOUNTER — Other Ambulatory Visit: Payer: Self-pay | Admitting: Family Medicine

## 2015-06-03 ENCOUNTER — Ambulatory Visit: Payer: BLUE CROSS/BLUE SHIELD | Admitting: Family Medicine

## 2015-06-20 ENCOUNTER — Other Ambulatory Visit: Payer: Self-pay

## 2015-06-20 ENCOUNTER — Ambulatory Visit (INDEPENDENT_AMBULATORY_CARE_PROVIDER_SITE_OTHER): Payer: Medicaid Other | Admitting: Family Medicine

## 2015-06-20 ENCOUNTER — Encounter: Payer: Self-pay | Admitting: Family Medicine

## 2015-06-20 VITALS — BP 130/80 | HR 70 | Wt 263.0 lb

## 2015-06-20 DIAGNOSIS — F329 Major depressive disorder, single episode, unspecified: Secondary | ICD-10-CM | POA: Diagnosis not present

## 2015-06-20 DIAGNOSIS — I671 Cerebral aneurysm, nonruptured: Secondary | ICD-10-CM

## 2015-06-20 DIAGNOSIS — R42 Dizziness and giddiness: Secondary | ICD-10-CM

## 2015-06-20 DIAGNOSIS — Z09 Encounter for follow-up examination after completed treatment for conditions other than malignant neoplasm: Secondary | ICD-10-CM

## 2015-06-20 DIAGNOSIS — F32A Depression, unspecified: Secondary | ICD-10-CM

## 2015-06-20 NOTE — Progress Notes (Signed)
   Subjective:    Patient ID: Stephanie Best, female    DOB: 27-Feb-1961, 54 y.o.   MRN: [id number]  HPI She is here for consultation. She was evaluated several months ago for dizziness that has been intermittent in nature. She was seen by neurology. An MRI was ordered and it did show an aneurysm. She was then seen by neurosurgery as well as by ENT. She apparently had also been involved in physical therapy for her dizziness. Her insurance lapsed and she did not follow-up with any of these. She is stressed over this as she has lost her job and does feel depressed.   Review of Systems     Objective:   Physical Exam Alert and in no distress otherwise not examined. Her demeanor is appropriate. The medical record was reviewed.      Assessment & Plan:  Vertigo - Plan: Ambulatory referral to Neurology  Aneurysm, cerebral, nonruptured - Plan: Ambulatory referral to Neurosurgery  Depression She essentially needs to get back into see the various specialists to get this resolved. The dizziness is still ongoing and apparently unrelated to her aneurysm. She has not seen a neurosurgeon since the procedure was ordered. Discussed the fact that her depression will clear up as soon as the dizziness and the aneurysm issues become resolved. She was comfortable with that.

## 2015-06-28 ENCOUNTER — Ambulatory Visit: Payer: BLUE CROSS/BLUE SHIELD | Admitting: Family Medicine

## 2015-07-29 ENCOUNTER — Encounter: Payer: Self-pay | Admitting: Family Medicine

## 2015-07-29 ENCOUNTER — Ambulatory Visit (INDEPENDENT_AMBULATORY_CARE_PROVIDER_SITE_OTHER): Payer: Medicaid Other | Admitting: Family Medicine

## 2015-07-29 VITALS — BP 128/80 | HR 70 | Wt 263.0 lb

## 2015-07-29 DIAGNOSIS — R42 Dizziness and giddiness: Secondary | ICD-10-CM

## 2015-07-29 DIAGNOSIS — R201 Hypoesthesia of skin: Secondary | ICD-10-CM

## 2015-07-29 DIAGNOSIS — G5621 Lesion of ulnar nerve, right upper limb: Secondary | ICD-10-CM

## 2015-07-29 NOTE — Progress Notes (Signed)
   Subjective:    Patient ID: Stephanie Best, female    DOB: December 20, 1960, 54 y.o.   MRN: [id number]  HPI She complains of multiple issues. She states that for the last month she has noted a pulling sensation to the meal aspect of the right breast. She notes that she has gained weight and that the pulling sensation tends to occur more when she does not wear her bra. She did have a mammogram done approximately one year ago. She also complains of numbness in the fourth and fifth fingers of the right hand especially when she bends her elbow while sleeping. Is does not occur at other times. She also has had decreased sensation to the lateral aspect of the great toe area no history of injury. No weakness. At the end of the encounter she then mentioned a three-day history of intermittent dizziness but no blurred vision, double vision, earache, sore throat. The dizziness tends to come and go and is much less of an issue today than it was 2 days ago.   Review of Systems     Objective:   Physical Exam Alert and in no distress. Exam of the medial aspect of the right breast showed no palpable lesions or tenderness.Tympanic membranes and canals are normal. Pharyngeal area is normal. Neck is supple without adenopathy or thyromegaly. Cardiac exam shows a regular sinus rhythm without murmurs or gallops. Lungs are clear to auscultation.Percussion over the ulnar notch did reproduce the symptoms in her fourth and fifth fingers.Decreased sensation is noted to the lateral aspect of the right great toe with normal motion.        Assessment & Plan:  Ulnar neuropathy of right upper extremity  Dizziness  Tactile hypesthesia Reassured her that I found nothing of concern with the breast and recommend watchful waiting and return here if further trouble. Explained that the decreased sensation in her toes probably not of major concerns since the area she is discussing is not vulnerable to external trauma. The dizziness  seems to be doing much better and therefore watchful waiting is appropriate. Did recommend she try to keep her elbow straight as possible to help with the neuropathy. Recommended using an Ace wrap on her elbow to keep from being bent.

## 2015-08-03 ENCOUNTER — Other Ambulatory Visit: Payer: Self-pay | Admitting: Obstetrics and Gynecology

## 2015-08-03 DIAGNOSIS — N644 Mastodynia: Secondary | ICD-10-CM

## 2015-08-11 ENCOUNTER — Other Ambulatory Visit: Payer: Self-pay

## 2015-08-11 ENCOUNTER — Other Ambulatory Visit: Payer: Self-pay | Admitting: Obstetrics and Gynecology

## 2015-08-11 DIAGNOSIS — N644 Mastodynia: Secondary | ICD-10-CM

## 2015-08-16 ENCOUNTER — Other Ambulatory Visit: Payer: Self-pay

## 2015-08-23 ENCOUNTER — Ambulatory Visit: Payer: Self-pay

## 2015-08-23 ENCOUNTER — Ambulatory Visit
Admission: RE | Admit: 2015-08-23 | Discharge: 2015-08-23 | Disposition: A | Discharge status: Home / Self Care | Payer: Medicaid Other | Source: Ambulatory Visit | Attending: Obstetrics and Gynecology | Admitting: Obstetrics and Gynecology

## 2015-08-23 DIAGNOSIS — N644 Mastodynia: Secondary | ICD-10-CM

## 2015-09-07 ENCOUNTER — Other Ambulatory Visit: Payer: Self-pay | Admitting: Family Medicine

## 2015-11-01 ENCOUNTER — Encounter: Payer: Self-pay | Admitting: Cardiovascular Disease

## 2015-12-29 ENCOUNTER — Other Ambulatory Visit: Payer: Self-pay | Admitting: Family Medicine

## 2016-02-06 ENCOUNTER — Other Ambulatory Visit: Payer: Self-pay | Admitting: Family Medicine

## 2016-02-06 NOTE — Telephone Encounter (Signed)
Dr Redmond School 0 refills is it okay to refill

## 2016-02-07 ENCOUNTER — Other Ambulatory Visit: Payer: Self-pay | Admitting: *Deleted

## 2016-02-07 MED ORDER — LISINOPRIL-HYDROCHLOROTHIAZIDE 20-12.5 MG PO TABS: 1.0000 | ORAL_TABLET | Freq: Every day | ORAL | Status: DC

## 2016-02-07 NOTE — Telephone Encounter (Signed)
Dr  Redmond School ok to refill

## 2016-03-06 ENCOUNTER — Ambulatory Visit (INDEPENDENT_AMBULATORY_CARE_PROVIDER_SITE_OTHER): Payer: Medicaid Other | Admitting: Family Medicine

## 2016-03-06 ENCOUNTER — Encounter: Payer: Self-pay | Admitting: Family Medicine

## 2016-03-06 VITALS — BP 142/96 | HR 98 | Temp 98.1°F | Resp 16 | Ht 64.0 in | Wt 257.2 lb

## 2016-03-06 DIAGNOSIS — R112 Nausea with vomiting, unspecified: Secondary | ICD-10-CM

## 2016-03-06 MED ORDER — ONDANSETRON HCL 4 MG PO TABS: 4.0000 mg | ORAL_TABLET | Freq: Three times a day (TID) | ORAL | Status: DC | PRN

## 2016-03-06 NOTE — Patient Instructions (Addendum)
Try Emetrol first and if that doesn't work then use the pills;get the motion sickness medication at the drugstore which should be the same as the meclizine

## 2016-03-06 NOTE — Progress Notes (Signed)
Subjective:     Patient ID: Stephanie Best, female   DOB: 1961-10-06, 55 y.o.   MRN: [id number]  HPI  [name] is a 55 yo woman with history of vertigo who presents with 1 day history of nausea and vomiting after feeling dizzy.  She started feeling dizzy around 11 AM yesterday and says that the dizziness and nausea felt like her previous episodes of vertigo.  She does not feel dizzy today.  She describes a regular diet prior to the event and no recent change in medications.  She does not have any associated stomach pain at rest.  She has not taken any medication for her nausea/vertigo.     Review of Systems     Objective:   Physical Exam  Alert and in no distress. Tympanic membranes and canals are normal. Pharyngeal area is normal. Neck is supple without adenopathy or thyromegaly. Cardiac exam shows a regular sinus rhythm without murmurs or gallops. Lungs are clear to auscultation.  No bowel sounds appreciated and abdomen non-tender to palpation in all quadrants.       Assessment:    Non-intractable vomiting with nausea, vomiting of unspecified type - Plan: ondansetron (ZOFRAN) 4 MG tablet    Hx of Vertigo     Plan:     Recommended to first try OTC emetrol for control of nausea and if that does not work progress to zofran given today.  Additionally recommended OTC meclizine that she has previously taken for vertigo control.       History and physical exam conducted by Marylen Ponto (Medical Student) in conjunction with Dr. Redmond School.

## 2016-04-16 ENCOUNTER — Other Ambulatory Visit: Payer: Self-pay | Admitting: Family Medicine

## 2016-12-13 ENCOUNTER — Encounter (HOSPITAL_COMMUNITY): Payer: Self-pay | Admitting: Neurosurgery

## 2016-12-20 ENCOUNTER — Other Ambulatory Visit: Payer: Self-pay | Admitting: Family Medicine

## 2017-04-08 ENCOUNTER — Other Ambulatory Visit: Payer: Self-pay | Admitting: Family Medicine

## 2017-05-22 ENCOUNTER — Encounter: Payer: Self-pay | Admitting: Internal Medicine

## 2017-07-03 ENCOUNTER — Ambulatory Visit: Payer: Medicare Other | Admitting: Medical

## 2017-07-03 ENCOUNTER — Other Ambulatory Visit: Payer: Self-pay | Admitting: Family Medicine

## 2017-07-03 DIAGNOSIS — N644 Mastodynia: Secondary | ICD-10-CM | POA: Diagnosis not present

## 2017-07-04 ENCOUNTER — Other Ambulatory Visit: Payer: Self-pay | Admitting: Obstetrics and Gynecology

## 2017-07-04 DIAGNOSIS — R234 Changes in skin texture: Secondary | ICD-10-CM

## 2017-07-10 ENCOUNTER — Ambulatory Visit
Admission: RE | Admit: 2017-07-10 | Discharge: 2017-07-10 | Disposition: A | Discharge status: Home / Self Care | Payer: Medicaid Other | Source: Ambulatory Visit | Attending: Obstetrics and Gynecology | Admitting: Obstetrics and Gynecology

## 2017-07-10 ENCOUNTER — Ambulatory Visit
Admission: RE | Admit: 2017-07-10 | Discharge: 2017-07-10 | Disposition: A | Discharge status: Home / Self Care | Payer: Medicare Other | Source: Ambulatory Visit | Attending: Obstetrics and Gynecology | Admitting: Obstetrics and Gynecology

## 2017-07-10 DIAGNOSIS — R234 Changes in skin texture: Secondary | ICD-10-CM

## 2017-07-10 DIAGNOSIS — R928 Other abnormal and inconclusive findings on diagnostic imaging of breast: Secondary | ICD-10-CM | POA: Diagnosis not present

## 2017-07-10 DIAGNOSIS — N6489 Other specified disorders of breast: Secondary | ICD-10-CM | POA: Diagnosis not present

## 2017-07-10 LAB — HM MAMMOGRAPHY

## 2017-07-25 DIAGNOSIS — N644 Mastodynia: Secondary | ICD-10-CM | POA: Diagnosis not present

## 2017-10-10 DIAGNOSIS — Z124 Encounter for screening for malignant neoplasm of cervix: Secondary | ICD-10-CM | POA: Diagnosis not present

## 2017-10-10 DIAGNOSIS — Z1231 Encounter for screening mammogram for malignant neoplasm of breast: Secondary | ICD-10-CM | POA: Diagnosis not present

## 2017-10-10 DIAGNOSIS — Z6841 Body Mass Index (BMI) 40.0 and over, adult: Secondary | ICD-10-CM | POA: Diagnosis not present

## 2017-10-10 DIAGNOSIS — Z779 Other contact with and (suspected) exposures hazardous to health: Secondary | ICD-10-CM | POA: Diagnosis not present

## 2017-10-10 LAB — HM PAP SMEAR: HM PAP: NEGATIVE

## 2017-10-14 ENCOUNTER — Other Ambulatory Visit: Payer: Self-pay | Admitting: Family Medicine

## 2017-10-14 NOTE — Telephone Encounter (Signed)
Pt request CB at 3:00PM.

## 2017-10-16 NOTE — Telephone Encounter (Signed)
Pt scheduled appt on 11/04/17 for med mgt. 30 day supply called in.

## 2017-11-04 ENCOUNTER — Encounter: Payer: Self-pay | Admitting: Family Medicine

## 2017-11-04 ENCOUNTER — Encounter: Payer: Self-pay | Admitting: Internal Medicine

## 2017-11-04 ENCOUNTER — Ambulatory Visit (INDEPENDENT_AMBULATORY_CARE_PROVIDER_SITE_OTHER): Payer: Medicare Other | Admitting: Family Medicine

## 2017-11-04 VITALS — BP 140/80 | HR 87 | Resp 16 | Wt 254.8 lb

## 2017-11-04 DIAGNOSIS — J4520 Mild intermittent asthma, uncomplicated: Secondary | ICD-10-CM

## 2017-11-04 DIAGNOSIS — I10 Essential (primary) hypertension: Secondary | ICD-10-CM

## 2017-11-04 DIAGNOSIS — E669 Obesity, unspecified: Secondary | ICD-10-CM

## 2017-11-04 DIAGNOSIS — Z86010 Personal history of colonic polyps: Secondary | ICD-10-CM

## 2017-11-04 DIAGNOSIS — Z1159 Encounter for screening for other viral diseases: Secondary | ICD-10-CM

## 2017-11-04 MED ORDER — DILTIAZEM HCL ER BEADS 240 MG PO CP24: 240.0000 mg | ORAL_CAPSULE | Freq: Every day | ORAL | 3 refills | Status: DC

## 2017-11-04 MED ORDER — LISINOPRIL-HYDROCHLOROTHIAZIDE 20-12.5 MG PO TABS: 1.0000 | ORAL_TABLET | Freq: Every day | ORAL | 3 refills | Status: DC

## 2017-11-04 NOTE — Progress Notes (Signed)
   Subjective:    Patient ID: Stephanie Best, female    DOB: 1961/08/01, 56 y.o.   MRN: [id number]  HPI She is here for an interval evaluation.  She does have a previous history of colonic polyps and will need to be referred for follow-up colonoscopy.  She rarely uses her inhaler and states that usually when she goes out for walks.  Continues on her blood pressure medications without difficulty.  She does state the desire to make some changes in her eating habits to help lose weight.  She did have a Pap and mammogram done this year.  Her immunizations were reviewed.  Family and social history was also reviewed.  Review of Systems     Objective:   Physical Exam Alert and in no distress. Tympanic membranes and canals are normal. Pharyngeal area is normal. Neck is supple without adenopathy or thyromegaly. Cardiac exam shows a regular sinus rhythm without murmurs or gallops. Lungs are clear to auscultation.  Abdominal exam shows no masses or tenderness with normal bowel sounds        Assessment & Plan:  Personal history of colonic polyps - Plan: Ambulatory referral to Gastroenterology  Mild intermittent asthma without complication  Essential hypertension - Plan: diltiazem (TIAZAC) 240 MG 24 hr capsule, lisinopril-hydrochlorothiazide (PRINZIDE,ZESTORETIC) 20-12.5 MG tablet  Obesity without serious comorbidity, unspecified classification, unspecified obesity type - Plan: CBC with Differential/Platelet, Comprehensive metabolic panel, Lipid panel  Encounter for hepatitis C screening test for low risk patient - Plan: Hepatitis C antibody She is continue on her present medication regimen with an increase in her Tiazac to 240 mg.  Also discussed the need for her to make diet and exercise changes encouraging her to cut back on carbohydrates and increase her physical activity at least 20 minutes daily. Not interested in getting a flu shot

## 2017-11-04 NOTE — Patient Instructions (Signed)
20 minutes of something physical every day Cut back on carbohydrates. The easiest thing to remember is cut back on white food Set a particular pants or dressed size is your goal

## 2017-11-05 LAB — COMPREHENSIVE METABOLIC PANEL
AG RATIO: 1.3 (calc) (ref 1.0–2.5)
ALBUMIN MSPROF: 3.9 g/dL (ref 3.6–5.1)
ALT: 14 U/L (ref 6–29)
AST: 19 U/L (ref 10–35)
Alkaline phosphatase (APISO): 74 U/L (ref 33–130)
BILIRUBIN TOTAL: 0.4 mg/dL (ref 0.2–1.2)
BUN: 11 mg/dL (ref 7–25)
CALCIUM: 9.1 mg/dL (ref 8.6–10.4)
CO2: 24 mmol/L (ref 20–32)
Chloride: 104 mmol/L (ref 98–110)
Creat: 0.82 mg/dL (ref 0.50–1.05)
Globulin: 2.9 g/dL (calc) (ref 1.9–3.7)
Glucose, Bld: 84 mg/dL (ref 65–99)
POTASSIUM: 4.2 mmol/L (ref 3.5–5.3)
SODIUM: 140 mmol/L (ref 135–146)
TOTAL PROTEIN: 6.8 g/dL (ref 6.1–8.1)

## 2017-11-05 LAB — CBC WITH DIFFERENTIAL/PLATELET
BASOS ABS: 30 {cells}/uL (ref 0–200)
Basophils Relative: 0.5 %
EOS ABS: 72 {cells}/uL (ref 15–500)
EOS PCT: 1.2 %
HEMATOCRIT: 41.7 % (ref 35.0–45.0)
HEMOGLOBIN: 13.5 g/dL (ref 11.7–15.5)
Lymphs Abs: 1956 cells/uL (ref 850–3900)
MCH: 25.6 pg — ABNORMAL LOW (ref 27.0–33.0)
MCHC: 32.4 g/dL (ref 32.0–36.0)
MCV: 79.1 fL — ABNORMAL LOW (ref 80.0–100.0)
MONOS PCT: 6.5 %
MPV: 12.3 fL (ref 7.5–12.5)
NEUTROS PCT: 59.2 %
Neutro Abs: 3552 cells/uL (ref 1500–7800)
Platelets: 325 10*3/uL (ref 140–400)
RBC: 5.27 10*6/uL — AB (ref 3.80–5.10)
RDW: 14.2 % (ref 11.0–15.0)
TOTAL LYMPHOCYTE: 32.6 %
WBC: 6 10*3/uL (ref 3.8–10.8)
WBCMIX: 390 {cells}/uL (ref 200–950)

## 2017-11-05 LAB — LIPID PANEL
Cholesterol: 168 mg/dL (ref <200)
HDL: 53 mg/dL (ref >50)
LDL CHOLESTEROL (CALC): 93 mg/dL
NON-HDL CHOLESTEROL (CALC): 115 mg/dL (ref <130)
Total CHOL/HDL Ratio: 3.2 (calc) (ref <5.0)
Triglycerides: 122 mg/dL (ref <150)

## 2017-11-05 LAB — HEPATITIS C ANTIBODY
Hepatitis C Ab: NONREACTIVE
SIGNAL TO CUT-OFF: 0.02 (ref <1.00)

## 2017-12-12 ENCOUNTER — Encounter: Payer: Self-pay | Admitting: Family Medicine

## 2017-12-16 ENCOUNTER — Encounter: Payer: Self-pay | Admitting: Family Medicine

## 2017-12-28 ENCOUNTER — Other Ambulatory Visit: Payer: Self-pay

## 2017-12-28 ENCOUNTER — Encounter (HOSPITAL_COMMUNITY): Payer: Self-pay | Admitting: Emergency Medicine

## 2017-12-28 ENCOUNTER — Ambulatory Visit (HOSPITAL_COMMUNITY)
Admission: EM | Admit: 2017-12-28 | Discharge: 2017-12-28 | Disposition: A | Discharge status: Home / Self Care | Payer: Medicare Other | Attending: Physician Assistant | Admitting: Physician Assistant

## 2017-12-28 DIAGNOSIS — B9789 Other viral agents as the cause of diseases classified elsewhere: Secondary | ICD-10-CM

## 2017-12-28 DIAGNOSIS — J069 Acute upper respiratory infection, unspecified: Secondary | ICD-10-CM

## 2017-12-28 MED ORDER — HYDROCODONE-HOMATROPINE 5-1.5 MG/5ML PO SYRP: 2.5000 mL | ORAL_SOLUTION | Freq: Every day | ORAL | 0 refills | Status: AC

## 2017-12-28 MED ORDER — BENZONATATE 100 MG PO CAPS: 100.0000 mg | ORAL_CAPSULE | Freq: Three times a day (TID) | ORAL | 0 refills | Status: DC

## 2017-12-28 NOTE — Discharge Instructions (Signed)
I would recommend taking 1000 mg of Tylenol every 8 hours.  He can use the cough syrup as labeled on the bottle but I would start with a lower dose.  The Tessalon should help you from coughing quite as much.  Please go home and take your blood pressure medicine.  Your symptoms may take up to 8-10 days to resolve completely.  If any point you have double sickening, fever greater than 100.4, chest pain, shortness of breath then please come back or consider going to the emergency department.

## 2017-12-28 NOTE — ED Triage Notes (Signed)
seen by provider only 

## 2017-12-28 NOTE — ED Provider Notes (Signed)
12/28/2017 6:40 PM   DOB: February 24, 1961 / MRN: [id number]  SUBJECTIVE:  Stephanie Best is a 57 y.o. female presenting for sore throat, nasal congestion and cough that started today.  He tells me that she has a sick grandchild at home.  And she has been nursing after him.  Denies shortness of breath, chest pain, leg swelling, dizziness, diaphoresis.  She takes  diltiazem for blood pressure control.  She missed this medication today.    She has No Known Allergies.   She  has a past medical history of Asthma, Hypertension, Obesity, and Vertigo (03/2014).    She  reports that  has never smoked. she has never used smokeless tobacco. She reports that she drinks about 0.6 oz of alcohol per week. She reports that she does not use drugs. She  reports that she does not currently engage in sexual activity. She reports using the following method of birth control/protection: Condom. The patient  has a past surgical history that includes Breast reduction surgery (1998); Knee arthroscopy (7672,0/9470); Abdominal hysterectomy; Shoulder surgery; ir generic historical (02/24/2015); ir generic historical (02/24/2015); and ir generic historical (02/24/2015).  Her family history includes Cancer (age of onset: 67) in her father; Healthy in her daughter; Hypertension in her mother; Other in her son; Stomach cancer (age of onset: 45) in her father.  Review of Systems  Constitutional: Negative for chills, fever and weight loss.  HENT: Negative for sore throat.   Respiratory: Positive for cough and sputum production. Negative for hemoptysis, shortness of breath and wheezing.   Cardiovascular: Negative for chest pain, palpitations, orthopnea, claudication, leg swelling and PND.  Genitourinary: Negative for dysuria.  Skin: Negative for itching and rash.  Neurological: Negative for dizziness.    OBJECTIVE:  BP (!) 151/70 (BP Location: Left Arm) Comment: Notified Provider  Pulse (!) 117 Comment: Notified Provider  Temp 98.6 F  (37 C) (Oral)   Resp 18   SpO2 97%   Physical Exam  Constitutional: She is active.  Non-toxic appearance.  Cardiovascular: Normal rate, regular rhythm, S1 normal, S2 normal, normal heart sounds and intact distal pulses.  No extrasystoles are present. Exam reveals no gallop, no friction rub and no decreased pulses.  No murmur heard. Pulse 104 by my assessment..  Pulmonary/Chest: Effort normal. No stridor. No tachypnea. No respiratory distress. She has no wheezes. She has no rales.  Abdominal: She exhibits no distension.  Musculoskeletal: She exhibits no edema.  Neurological: She is alert.  Skin: Skin is warm and dry. She is not diaphoretic. No pallor.    No results found for this or any previous visit (from the past 72 hour(s)).  No results found.  ASSESSMENT AND PLAN:  No orders of the defined types were placed in this encounter.    Viral URI with cough: She is well-appearing.  Mildly tachycardic however no shortness of breath and breath sounds completely normal.  Providing cough syrup, benzonatate advising Tylenol.  RTC and ED precautions discussed.      The patient is advised to call or return to clinic if she does not see an improvement in symptoms, or to seek the care of the closest emergency department if she worsens with the above plan.   Philis Fendt, MHS, PA-C 12/28/2017 6:40 PM    Tereasa Coop, PA-C 12/28/17 1841

## 2018-01-02 ENCOUNTER — Telehealth: Payer: Self-pay | Admitting: *Deleted

## 2018-01-02 NOTE — Telephone Encounter (Signed)
Attempted to call patient to reschedule her PV as she did not show today.  Left message for her to call back and reschedule before 5 today or both PV and Colonoscopy will be cancelled.  B.Benitez, CMA  PV

## 2018-01-03 ENCOUNTER — Encounter: Payer: Self-pay | Admitting: Family Medicine

## 2018-01-06 ENCOUNTER — Encounter: Payer: Self-pay | Admitting: Family Medicine

## 2018-01-06 ENCOUNTER — Telehealth: Payer: Self-pay | Admitting: Family Medicine

## 2018-01-06 ENCOUNTER — Ambulatory Visit (INDEPENDENT_AMBULATORY_CARE_PROVIDER_SITE_OTHER): Payer: Medicare Other | Admitting: Family Medicine

## 2018-01-06 VITALS — BP 138/92 | HR 92 | Wt 256.2 lb

## 2018-01-06 DIAGNOSIS — J4520 Mild intermittent asthma, uncomplicated: Secondary | ICD-10-CM

## 2018-01-06 DIAGNOSIS — H6693 Otitis media, unspecified, bilateral: Secondary | ICD-10-CM | POA: Diagnosis not present

## 2018-01-06 DIAGNOSIS — Z560 Unemployment, unspecified: Secondary | ICD-10-CM | POA: Insufficient documentation

## 2018-01-06 MED ORDER — FLUCONAZOLE 150 MG PO TABS: 150.0000 mg | ORAL_TABLET | Freq: Once | ORAL | 0 refills | Status: AC

## 2018-01-06 MED ORDER — ALBUTEROL SULFATE HFA 108 (90 BASE) MCG/ACT IN AERS: 2.0000 | INHALATION_SPRAY | RESPIRATORY_TRACT | 0 refills | Status: DC | PRN

## 2018-01-06 MED ORDER — AMOXICILLIN 875 MG PO TABS: 875.0000 mg | ORAL_TABLET | Freq: Two times a day (BID) | ORAL | 0 refills | Status: DC

## 2018-01-06 NOTE — Progress Notes (Signed)
   Subjective:    Patient ID: Stephanie Best, female    DOB: March 28, 1961, 57 y.o.   MRN: [id number]  HPI She complains of difficulty with ear congestion, dry cough, wheezing, shortness of breath but no fever chills, sore throat.  She does have a history of asthma and has been using her inhaler.  She only uses this when she has trouble which is rarely.   Review of Systems     Objective:   Physical Exam Alert and in no distress. Tympanic membranes are both dull and vascular.  Canals are normal. Pharyngeal area is normal. Neck is supple without adenopathy or thyromegaly. Cardiac exam shows a regular sinus rhythm without murmurs or gallops. Lungs are clear to auscultation.        Assessment & Plan:  Bilateral otitis media, unspecified otitis media type - Plan: amoxicillin (AMOXIL) 875 MG tablet  Mild intermittent asthmatic bronchitis without complication - Plan: albuterol (PROVENTIL HFA;VENTOLIN HFA) 108 (90 Base) MCG/ACT inhaler  Not currently working due to disabled status During the encounter she asked me to fill out a form for disability placard.  She states that she has vertigo as well as an aneurysm is the reason behind this then also complains of shortness of breath.  I explained that it would be difficult for me to fill this out based on her complaint of vertigo and an aneurysm that I do not have any information on.  Then discussed the fact that if she is having difficulty with shortness of breath we need to work this up.

## 2018-01-06 NOTE — Telephone Encounter (Signed)
Called Albuterol in to [facility] on [address]

## 2018-01-06 NOTE — Telephone Encounter (Signed)
Called pt to find out why she did not go to [facility] , she said she was sick and forgot.  She will go when she feels better.

## 2018-01-09 ENCOUNTER — Encounter: Payer: Medicare Other | Admitting: Internal Medicine

## 2018-01-14 ENCOUNTER — Ambulatory Visit (HOSPITAL_COMMUNITY)
Admission: EM | Admit: 2018-01-14 | Discharge: 2018-01-14 | Disposition: A | Discharge status: Home / Self Care | Payer: Medicare Other | Attending: Physician Assistant | Admitting: Physician Assistant

## 2018-01-14 DIAGNOSIS — Z8709 Personal history of other diseases of the respiratory system: Secondary | ICD-10-CM

## 2018-01-14 DIAGNOSIS — B9789 Other viral agents as the cause of diseases classified elsewhere: Secondary | ICD-10-CM

## 2018-01-14 DIAGNOSIS — J069 Acute upper respiratory infection, unspecified: Secondary | ICD-10-CM

## 2018-01-14 MED ORDER — BENZONATATE 100 MG PO CAPS: 100.0000 mg | ORAL_CAPSULE | Freq: Three times a day (TID) | ORAL | 0 refills | Status: DC

## 2018-01-14 MED ORDER — HYDROCODONE-HOMATROPINE 5-1.5 MG/5ML PO SYRP: 2.5000 mL | ORAL_SOLUTION | Freq: Every day | ORAL | 0 refills | Status: AC

## 2018-01-14 MED ORDER — FLUTICASONE PROPIONATE HFA 44 MCG/ACT IN AERO: 2.0000 | INHALATION_SPRAY | Freq: Two times a day (BID) | RESPIRATORY_TRACT | 12 refills | Status: DC

## 2018-01-14 NOTE — ED Triage Notes (Signed)
Seen by provider

## 2018-01-14 NOTE — ED Provider Notes (Signed)
01/14/2018 6:12 PM   DOB: 04/05/1961 / MRN: [id number]  SUBJECTIVE:  Stephanie Best is a 57 y.o. female presenting for cough preceded by fever that came and went 2 days ago.  She was recently treated for bilateral otitis media. She has not taken her BP medication today.  She has a good relationship with her PCP. She feels that she is getting worse. SHe has a history of asthma and is out of her steroid inhaler.   She has No Known Allergies.   She  has a past medical history of Asthma, Hypertension, Obesity, and Vertigo (03/2014).    She  reports that  has never smoked. she has never used smokeless tobacco. She reports that she drinks about 0.6 oz of alcohol per week. She reports that she does not use drugs. She  reports that she does not currently engage in sexual activity. She reports using the following method of birth control/protection: Condom. The patient  has a past surgical history that includes Breast reduction surgery (1998); Knee arthroscopy (6301,05/108); Abdominal hysterectomy; Shoulder surgery; ir generic historical (02/24/2015); ir generic historical (02/24/2015); and ir generic historical (02/24/2015).  Her family history includes Cancer (age of onset: 55) in her father; Healthy in her daughter; Hypertension in her mother; Other in her son; Stomach cancer (age of onset: 73) in her father.  Review of Systems  Constitutional: Negative for chills, diaphoresis and fever.  HENT: Positive for congestion and sore throat.   Respiratory: Positive for cough. Negative for hemoptysis, sputum production, shortness of breath and wheezing.   Cardiovascular: Negative for chest pain.  Gastrointestinal: Negative for nausea.  Skin: Negative for rash.  Neurological: Negative for dizziness.    OBJECTIVE:  BP (!) 164/95 (BP Location: Left Arm) Comment: Provider in Room  Pulse 83   Temp 99.5 F (37.5 C) (Oral)   Resp 20   SpO2 95%   Physical Exam  Constitutional: She is active.  Non-toxic  appearance.  Cardiovascular: Normal rate, regular rhythm, S1 normal, S2 normal, normal heart sounds and intact distal pulses. Exam reveals no gallop, no friction rub and no decreased pulses.  No murmur heard. Pulmonary/Chest: Effort normal. No stridor. No tachypnea. No respiratory distress. She has no wheezes. She has no rales.  Abdominal: She exhibits no distension.  Musculoskeletal: She exhibits no edema.  Neurological: She is alert.  Skin: Skin is warm and dry. She is not diaphoretic. No pallor.    No results found for this or any previous visit (from the past 72 hour(s)).  No results found.  ASSESSMENT AND PLAN:    Viral URI with cough: Exam reassuring.  Will restart her tessaolon, provide her with nacrotic cough syrup, and steroid inhaler to prevent an asthma flare. Patient appreciative of our care.   History of asthma      The patient is advised to call or return to clinic if she does not see an improvement in symptoms, or to seek the care of the closest emergency department if she worsens with the above plan.   Philis Fendt, MHS, PA-C 01/14/2018 6:12 PM    Tereasa Coop, PA-C 01/14/18 [phone]

## 2018-01-14 NOTE — Discharge Instructions (Signed)
Please go home and take your BP medication. Come back here if you are not getting better in the next week or so. Come back sooner if you are getting worse.

## 2018-01-27 ENCOUNTER — Encounter (HOSPITAL_COMMUNITY): Payer: Self-pay | Admitting: Emergency Medicine

## 2018-01-27 ENCOUNTER — Emergency Department (HOSPITAL_COMMUNITY): Payer: Medicare Other

## 2018-01-27 DIAGNOSIS — J45909 Unspecified asthma, uncomplicated: Secondary | ICD-10-CM | POA: Insufficient documentation

## 2018-01-27 DIAGNOSIS — I10 Essential (primary) hypertension: Secondary | ICD-10-CM | POA: Insufficient documentation

## 2018-01-27 DIAGNOSIS — Z79899 Other long term (current) drug therapy: Secondary | ICD-10-CM | POA: Insufficient documentation

## 2018-01-27 DIAGNOSIS — R42 Dizziness and giddiness: Secondary | ICD-10-CM | POA: Insufficient documentation

## 2018-01-27 LAB — COMPREHENSIVE METABOLIC PANEL
ALT: 19 U/L (ref 14–54)
ANION GAP: 10 (ref 5–15)
AST: 24 U/L (ref 15–41)
Albumin: 3.8 g/dL (ref 3.5–5.0)
Alkaline Phosphatase: 73 U/L (ref 38–126)
BUN: 7 mg/dL (ref 6–20)
CHLORIDE: 103 mmol/L (ref 101–111)
CO2: 25 mmol/L (ref 22–32)
CREATININE: 0.89 mg/dL (ref 0.44–1.00)
Calcium: 9.2 mg/dL (ref 8.9–10.3)
Glucose, Bld: 117 mg/dL — ABNORMAL HIGH (ref 65–99)
POTASSIUM: 3.6 mmol/L (ref 3.5–5.1)
Sodium: 138 mmol/L (ref 135–145)
Total Bilirubin: 0.5 mg/dL (ref 0.3–1.2)
Total Protein: 7.2 g/dL (ref 6.5–8.1)

## 2018-01-27 LAB — CBC WITH DIFFERENTIAL/PLATELET
Basophils Absolute: 0 10*3/uL (ref 0.0–0.1)
Basophils Relative: 0 %
EOS ABS: 0.1 10*3/uL (ref 0.0–0.7)
Eosinophils Relative: 1 %
HCT: 40.7 % (ref 36.0–46.0)
HEMOGLOBIN: 13.6 g/dL (ref 12.0–15.0)
LYMPHS ABS: 2 10*3/uL (ref 0.7–4.0)
Lymphocytes Relative: 29 %
MCH: 27.5 pg (ref 26.0–34.0)
MCHC: 33.4 g/dL (ref 30.0–36.0)
MCV: 82.4 fL (ref 78.0–100.0)
Monocytes Absolute: 0.4 10*3/uL (ref 0.1–1.0)
Monocytes Relative: 5 %
NEUTROS ABS: 4.5 10*3/uL (ref 1.7–7.7)
NEUTROS PCT: 65 %
Platelets: 285 10*3/uL (ref 150–400)
RBC: 4.94 MIL/uL (ref 3.87–5.11)
RDW: 15.3 % (ref 11.5–15.5)
WBC: 6.9 10*3/uL (ref 4.0–10.5)

## 2018-01-27 MED ORDER — ONDANSETRON 4 MG PO TBDP
4.0000 mg | ORAL_TABLET | Freq: Once | ORAL | Status: AC | PRN
  Administered 2018-01-27: 4 mg via ORAL
  Filled 2018-01-27: qty 1

## 2018-01-27 NOTE — ED Triage Notes (Signed)
Pt to ED c/o sudden onset dizziness at 1330 today - reports hx of vertigo and states the room is spinning the same like when she has vertigo. Pt also reports nausea. She states she is being followed by Dr. Kathyrn Sheriff for an aneurysm in her head. Patient denies headache or vision changes.

## 2018-01-27 NOTE — ED Provider Notes (Signed)
Patient placed in Quick Look pathway, seen and evaluated   Chief Complaint: Dizziness  HPI:   Stephanie Best is a 57 y.o. female, presenting to the ED with feeling of "swimmy headedness."  Accompanied by intermittent bilateral blurry vision and nausea.  ROS: Dizziness (one)  Physical Exam:   Gen: No distress  Neuro: Awake and Alert  Skin: Warm    Focused Exam:  No diaphoresis.  No pallor.  Pulmonary: No increased work of breathing.  Speaks in full sentences without difficulty.  Lung sounds clear.  No tachypnea.  Cardiac: Normal rate and regular. Peripheral pulses intact.  Abdominal: No abdominal tenderness.  No peritoneal signs.  No rebound tenderness.  No guarding.  No CVA tenderness.  Neurologic: No sensory deficits. Strength 5/5 in all extremities. No gait disturbance. Coordination intact including heel to shin and finger to nose. Cranial nerves III-XII grossly intact. No facial droop.   MSK: No peripheral edema.   Initiation of care has begun. The patient has been counseled on the process, plan, and necessity for staying for the completion/evaluation, and the remainder of the medical screening examination   Lorayne Bender, PA-C 01/27/18 1618

## 2018-01-28 ENCOUNTER — Emergency Department (HOSPITAL_COMMUNITY)
Admission: EM | Admit: 2018-01-28 | Discharge: 2018-01-28 | Disposition: A | Discharge status: Home / Self Care | Payer: Medicare Other | Attending: Emergency Medicine | Admitting: Emergency Medicine

## 2018-01-28 ENCOUNTER — Emergency Department (HOSPITAL_COMMUNITY): Payer: Medicare Other

## 2018-01-28 DIAGNOSIS — R42 Dizziness and giddiness: Secondary | ICD-10-CM | POA: Diagnosis not present

## 2018-01-28 MED ORDER — MECLIZINE HCL 25 MG PO TABS
25.0000 mg | ORAL_TABLET | Freq: Once | ORAL | Status: AC
Start: 2018-01-28 — End: 2018-01-28
  Administered 2018-01-28: 25 mg via ORAL
  Filled 2018-01-28: qty 1

## 2018-01-28 MED ORDER — MECLIZINE HCL 12.5 MG PO TABS: 12.5000 mg | ORAL_TABLET | Freq: Three times a day (TID) | ORAL | 0 refills | Status: DC | PRN

## 2018-01-28 MED ORDER — ONDANSETRON 4 MG PO TBDP: 4.0000 mg | ORAL_TABLET | Freq: Three times a day (TID) | ORAL | 0 refills | Status: DC | PRN

## 2018-01-28 MED ORDER — ONDANSETRON 4 MG PO TBDP
4.0000 mg | ORAL_TABLET | Freq: Once | ORAL | Status: AC
  Administered 2018-01-28: 4 mg via ORAL
  Filled 2018-01-28: qty 1

## 2018-01-28 NOTE — ED Notes (Signed)
Called MRI to see where pt was in on the waiting list. Pt advised of MRI wait time.

## 2018-01-28 NOTE — ED Provider Notes (Signed)
[facility] EMER[facility]ENCY DEPARTMENT Provider Note   CSN: [id number] Arrival date & time: 01/27/18  1453     History   Chief Complaint Chief Complaint  Patient presents with  . Dizziness    HPI Stephanie Best is a 57 y.o. female.  Patient presents with sudden onset dizziness and lightheadedness that onset around 1 PM while she was at home watching TV.  States she had a sudden onset feeling of "heavy body" accompanied by diffuse numbness in her head.  She describes a swimming feeling similar to previous episodes of vertigo that is positional.  It is associated with some blurry vision or nausea.  Since she arrived to the ED she had a gradual onset headache with photophobia.  No focal weakness, numbness or tingling.  No chest pain or shortness of breath.  States she has had vertigo in the past but not for a long time and this episode is similar.  Did not take any medication for it at home.  She is concerned because she has a history of a cerebral aneurysm that is being followed by neurosurgery.  No previous surgeries.   The history is provided by the patient.  Dizziness  Associated symptoms: nausea   Associated symptoms: no chest pain, no shortness of breath, no vomiting and no weakness     Past Medical History:  Diagnosis Date  . Asthma   . Hypertension   . Obesity   . Vertigo 03/2014    Patient Active Problem List   Diagnosis Date Noted  . Not currently working due to disabled status 01/06/2018  . Personal history of colonic polyps 04/09/2014  . Hypertension 05/15/2011  . Asthma 05/15/2011  . Obesity     Past Surgical History:  Procedure Laterality Date  . ABDOMINAL HYSTERECTOMY    . BREAST REDUCTION SURGERY  1998  . IR GENERIC HISTORICAL  02/24/2015   IR ANGIO INTRA EXTRACRAN SEL COM CAROTID INNOMINATE UNI L MOD SED 02/24/2015 Consuella Lose, MD [facility]  . IR GENERIC HISTORICAL  02/24/2015   IR ANGIO INTRA EXTRACRAN SEL INTERNAL CAROTID UNI R  MOD SED 02/24/2015 Consuella Lose, MD [facility]  . IR GENERIC HISTORICAL  02/24/2015   IR ANGIO VERTEBRAL SEL VERTEBRAL BILAT MOD SED 02/24/2015 Consuella Lose, MD [facility]  . KNEE ARTHROSCOPY  1995,06/2008   LEFT,RIGHT  . SHOULDER SURGERY     tumor removal    OB History    No data available       Home Medications    Prior to Admission medications   Medication Sig Start Date End Date Taking? Authorizing Provider  albuterol (PROVENTIL HFA;VENTOLIN HFA) 108 (90 Base) MCG/ACT inhaler Inhale 2 puffs into the lungs every 4 (four) hours as needed for wheezing or shortness of breath. 01/06/18   Denita Lung, MD  benzonatate (TESSALON) 100 MG capsule Take 1-2 capsules (100-200 mg total) by mouth every 8 (eight) hours. 01/14/18   Tereasa Coop, PA-C  diltiazem (TIAZAC) 240 MG 24 hr capsule Take 1 capsule (240 mg total) by mouth daily. 11/04/17   Denita Lung, MD  fluticasone (FLOVENT HFA) 44 MCG/ACT inhaler Inhale 2 puffs into the lungs 2 (two) times daily. 01/14/18   Tereasa Coop, PA-C  lisinopril-hydrochlorothiazide (PRINZIDE,ZESTORETIC) 20-12.5 MG tablet Take 1 tablet by mouth daily. 11/04/17   Denita Lung, MD    Family History Family History  Problem Relation Age of Onset  . Hypertension Mother   . Cancer Father  68       Stomach cancer  . Stomach cancer Father 49  . Other Son        Killed, 6  . Healthy Daughter        78  . Colon cancer Neg Hx   . Colon polyps Neg Hx   . Rectal cancer Neg Hx     Social History Social History   Tobacco Use  . Smoking status: Never Smoker  . Smokeless tobacco: Never Used  Substance Use Topics  . Alcohol use: Yes    Alcohol/week: 0.6 oz    Types: 1 Glasses of wine per week    Comment: Rarely  . Drug use: No     Allergies   Patient has no known allergies.   Review of Systems Review of Systems  Constitutional: Negative for activity change, appetite change and fever.  HENT: Negative for congestion and  postnasal drip.   Eyes: Positive for photophobia.  Respiratory: Negative for cough, chest tightness and shortness of breath.   Cardiovascular: Negative for chest pain.  Gastrointestinal: Positive for nausea. Negative for abdominal pain and vomiting.  Genitourinary: Negative for dysuria, hematuria, vaginal bleeding and vaginal discharge.  Musculoskeletal: Negative for arthralgias and myalgias.  Skin: Negative for rash.  Neurological: Positive for dizziness and light-headedness. Negative for syncope and weakness.    all other systems are negative except as noted in the HPI and PMH.    Physical Exam Updated Vital Signs BP (!) 129/59   Pulse 80   Temp 98.2 F (36.8 C)   Resp 18   Ht 5\' 4"  (1.626 m)   Wt 114.3 kg (252 lb)   SpO2 95%   BMI 43.26 kg/m   Physical Exam  Constitutional: She is oriented to person, place, and time. She appears well-developed and well-nourished. No distress.  HENT:  Head: Normocephalic and atraumatic.  Mouth/Throat: Oropharynx is clear and moist. No oropharyngeal exudate.  Eyes: Conjunctivae and EOM are normal. Pupils are equal, round, and reactive to light.  Neck: Normal range of motion. Neck supple.  No meningismus.  Cardiovascular: Normal rate, regular rhythm, normal heart sounds and intact distal pulses.  No murmur heard. Pulmonary/Chest: Effort normal and breath sounds normal. No respiratory distress. She exhibits no tenderness.  Abdominal: Soft. There is no tenderness. There is no rebound and no guarding.  Musculoskeletal: Normal range of motion. She exhibits no edema or tenderness.  Neurological: She is alert and oriented to person, place, and time. No cranial nerve deficit. She exhibits normal muscle tone. Coordination normal.  No ataxia on finger to nose bilaterally. No pronator drift. 5/5 strength throughout. CN 2-12 intact.Equal grip strength. Sensation intact.  No nystagmus, head impulse testing negative.  Negative Romberg.  Skin: Skin is  warm. Capillary refill takes less than 2 seconds. No rash noted.  Psychiatric: She has a normal mood and affect. Her behavior is normal.  Nursing note and vitals reviewed.    ED Treatments / Results  Labs (all labs ordered are listed, but only abnormal results are displayed) Labs Reviewed  COMPREHENSIVE METABOLIC PANEL - Abnormal; Notable for the following components:      Result Value   Glucose, Bld 117 (*)    All other components within normal limits  CBC WITH DIFFERENTIAL/PLATELET    EKG  EKG Interpretation  Date/Time:  Monday January 27 2018 15:28:05 EST Ventricular Rate:  78 PR Interval:  190 QRS Duration: 80 QT Interval:  400 QTC Calculation: 456 R Axis:   -  25 Text Interpretation:  Normal sinus rhythm Moderate voltage criteria for LVH, may be normal variant Cannot rule out Septal infarct , age undetermined T wave abnormality, consider lateral ischemia Abnormal ECG T wave inversion improved  Confirmed by Ezequiel Essex [phone]) on 01/28/2018 1:02:41 AM       Radiology Ct Head Wo Contrast  Result Date: 01/27/2018 CLINICAL DATA:  57 year old hypertensive female with onset of dizziness at 1:30 p.m. History of vertigo. Patient states room is spinning the same as when she has vertigo. Nausea. Anterior communicating artery aneurysm. Patient denies headache or visual changes per nursing staff. Initial encounter. EXAM: CT HEAD WITHOUT CONTRAST TECHNIQUE: Contiguous axial images were obtained from the base of the skull through the vertex without intravenous contrast. COMPARISON:  Three hundred seventeen and 01/07/2015 MR. 09/26/2014 CT. FINDINGS: Brain: No intracranial hemorrhage or CT evidence of large acute infarct. No intracranial mass lesion noted on this unenhanced exam. Vascular: No hyperdense vessel. Not able to assess aneurysm on unenhanced CT. Skull: Negative Sinuses/Orbits: No acute orbital abnormality. Mucosal thickening right maxillary sinus. Partial opacification ethmoid  sinus air cells bilaterally. Opacification superiorly extending right ethmoid sinus/frontal sinus. Partial opacification right sphenoid sinus. Other: Mastoid air cells and middle ear cavities are clear. IMPRESSION: No intracranial hemorrhage or CT evidence of large acute infarct. Not able to assess anterior communicating artery aneurysm on unenhanced head CT. Mucosal thickening right maxillary sinus. Partial opacification ethmoid sinus air cells bilaterally. Opacification superiorly extending right ethmoid sinus/frontal sinus. Partial opacification right sphenoid sinus. Electronically Signed   By: Genia Del M.D.   On: 01/27/2018 16:43    Procedures Procedures (including critical care time)  Medications Ordered in ED Medications  meclizine (ANTIVERT) tablet 25 mg (not administered)  ondansetron (ZOFRAN-ODT) disintegrating tablet 4 mg (not administered)  ondansetron (ZOFRAN-ODT) disintegrating tablet 4 mg (4 mg Oral Given 01/27/18 1558)     Initial Impression / Assessment and Plan / ED Course  I have reviewed the triage vital signs and the nursing notes.  Pertinent labs & imaging results that were available during my care of the patient were reviewed by me and considered in my medical decision making (see chart for details).    Patient with vertigo, gradual onset headache and nausea.  Neurological exam is nonfocal.  CT scan obtained in triage shows evidence of sinusitis.  Aneurysm not well assessed on noncontrast CT.  Doubt rupture of her aneurysm as patient appears well and has no significant headache.  CT scan does show evidence of sinusitis. Labs reassuring. Headache and nausea improved with treatment in the ED.  MRI/MRA will be obtained to further evaluate her ataxia and vertigo as well as assess stability of her ACA aneurysm.  Dr. Thomasene Lot to assume care at shift change. Final Clinical Impressions(s) / ED Diagnoses   Final diagnoses:  Vertigo    ED Discharge Orders    None        Jolinda Pinkstaff, Annie Main, MD 01/28/18 [phone]

## 2018-01-28 NOTE — ED Notes (Signed)
Contacted MRI over wait time

## 2018-01-28 NOTE — Discharge Instructions (Addendum)
Follow up with your doctor. Return to the ED if you develop new or worsening symptoms.   Your MRI shows no new acute changes, you still have the anuerysm (outpouching of the artery) and you have changes in your brain consistne with chronic vascular changes.  You also have sinus disease that could be contributing to your dizziness.

## 2018-01-28 NOTE — ED Notes (Signed)
Patient transported to MRI 

## 2018-02-06 ENCOUNTER — Ambulatory Visit (INDEPENDENT_AMBULATORY_CARE_PROVIDER_SITE_OTHER): Payer: Medicare Other | Admitting: Medical

## 2018-02-06 ENCOUNTER — Encounter: Payer: Self-pay | Admitting: Medical

## 2018-02-06 ENCOUNTER — Ambulatory Visit: Payer: Medicare Other | Admitting: Family Medicine

## 2018-02-06 VITALS — BP 134/82 | HR 92 | Temp 98.5°F | Wt 257.8 lb

## 2018-02-06 DIAGNOSIS — E669 Obesity, unspecified: Secondary | ICD-10-CM

## 2018-02-06 DIAGNOSIS — J329 Chronic sinusitis, unspecified: Secondary | ICD-10-CM

## 2018-02-06 DIAGNOSIS — J4520 Mild intermittent asthma, uncomplicated: Secondary | ICD-10-CM | POA: Diagnosis not present

## 2018-02-06 DIAGNOSIS — Z560 Unemployment, unspecified: Secondary | ICD-10-CM

## 2018-02-06 DIAGNOSIS — H6690 Otitis media, unspecified, unspecified ear: Secondary | ICD-10-CM | POA: Insufficient documentation

## 2018-02-06 DIAGNOSIS — R42 Dizziness and giddiness: Secondary | ICD-10-CM | POA: Diagnosis not present

## 2018-02-06 DIAGNOSIS — I10 Essential (primary) hypertension: Secondary | ICD-10-CM | POA: Diagnosis not present

## 2018-02-06 MED ORDER — AMOXICILLIN-POT CLAVULANATE 875-125 MG PO TABS: 1.0000 | ORAL_TABLET | Freq: Two times a day (BID) | ORAL | 0 refills | Status: DC

## 2018-02-06 MED ORDER — FLUCONAZOLE 150 MG PO TABS: 150.0000 mg | ORAL_TABLET | Freq: Once | ORAL | 0 refills | Status: AC

## 2018-02-06 MED ORDER — FLUTICASONE-SALMETEROL 250-50 MCG/DOSE IN AEPB: 1.0000 | INHALATION_SPRAY | Freq: Two times a day (BID) | RESPIRATORY_TRACT | 1 refills | Status: DC

## 2018-02-06 MED ORDER — ALBUTEROL SULFATE HFA 108 (90 BASE) MCG/ACT IN AERS: 2.0000 | INHALATION_SPRAY | RESPIRATORY_TRACT | 0 refills | Status: AC | PRN

## 2018-02-06 NOTE — Progress Notes (Signed)
Subjective: Chief Complaint  Patient presents with  . Follow-up    follow up ear infection   Medical team: Denita Lung, MD here for primary care Dr. Kathyrn Sheriff, neurosurgery Dr. Radene Journey, ENT Sees dentist and eye doctor  Here for f/u from vertigo and ear infection.   Was seen 01/06/18 by Dr. Redmond School here for ear infection, sinus infection, and ended being seen at the ED 01/14/18 and 01/28/18 for vertigo and viral URI.  On 01/06/18 completed a course of Amoxicillin 875mg  BID x 10 days.   Currently feels stopped up in the head.     She has hx/o asthma, HTN, vertigo, known brain aneurysm, on disability for aneurysm and vertigo.    She notes chronic sinus and ear infections.  Gets several per year.   Has seen ENT /Dr. Lucia Gaskins back in 2016.      Hx/o aneurysm - sees. Dr. Kathyrn Sheriff yearly for surveillance.   Saw 2018 although the last records we have to review were from 2016.    Asthma - uses Albuterol numerous times a day for several weeks.  Takes Flovent daily as well.  Recently has been has been flared up and she usually does get flareups during the spring  No other aggravating or relieving factors. No other complaint.   Past Medical History:  Diagnosis Date  . Asthma   . Hypertension   . Obesity   . Vertigo 03/2014   Current Outpatient Medications on File Prior to Visit  Medication Sig Dispense Refill  . benzonatate (TESSALON) 100 MG capsule Take 1-2 capsules (100-200 mg total) by mouth every 8 (eight) hours. (Patient taking differently: Take 100-200 mg by mouth 3 (three) times daily as needed for cough. ) 21 capsule 0  . diltiazem (TIAZAC) 240 MG 24 hr capsule Take 1 capsule (240 mg total) by mouth daily. 90 capsule 3  . fluticasone (FLOVENT HFA) 44 MCG/ACT inhaler Inhale 2 puffs into the lungs 2 (two) times daily. 1 Inhaler 12  . lisinopril-hydrochlorothiazide (PRINZIDE,ZESTORETIC) 20-12.5 MG tablet Take 1 tablet by mouth daily. 90 tablet 3  . meclizine (ANTIVERT) 12.5 MG  tablet Take 1 tablet (12.5 mg total) by mouth 3 (three) times daily as needed for dizziness. 30 tablet 0  . ondansetron (ZOFRAN ODT) 4 MG disintegrating tablet Take 1 tablet (4 mg total) by mouth every 8 (eight) hours as needed for nausea or vomiting. 20 tablet 0   No current facility-administered medications on file prior to visit.    ROS as in subjective    Objective: BP 134/82   Pulse 92   Temp 98.5 F (36.9 C)   Wt 257 lb 12.8 oz (116.9 kg)   SpO2 95%   BMI 44.25 kg/m     General appearance: alert, no distress, WD/WN, AA female, obese HEENT: normocephalic, sclerae anicteric, PERRLA, EOMi, nares with erythema, some mucous discharge, TMs flat, mild erythema, pharynx normal Oral cavity: MMM, no lesions Neck: supple, no lymphadenopathy, no thyromegaly, no masses, no bruits Heart: RRR, normal S1, S2, no murmurs Lungs: CTA bilaterally, no wheezes, rhonchi, or rales Extremities: no edema, no cyanosis, no clubbing Pulses: 2+ symmetric, upper and lower extremities, normal cap refill Neurological: alert, oriented x 3, CN2-12 intact, strength normal upper extremities and lower extremities, sensation normal throughout, DTRs 2+ throughout, no cerebellar signs, gait normal Psychiatric: normal affect, behavior normal, pleasant    CT head 01/27/18 IMPRESSION: No intracranial hemorrhage or CT evidence of large acute infarct.  Not able to assess anterior communicating  artery aneurysm on unenhanced head CT.  Mucosal thickening right maxillary sinus. Partial opacification ethmoid sinus air cells bilaterally. Opacification superiorly extending right ethmoid sinus/frontal sinus. Partial opacification right sphenoid sinus.   MRI brain 01/28/18 IMPRESSION: 1. No acute intracranial finding including infarct. 2. Bilateral partial mastoid opacification. Mild mucosal thickening in the paranasal sinuses. 3. Mild white matter disease with mild progression from 2016. The pattern of  involvement is nonspecific, history suggests chronic microvascular ischemia. 4. 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   MRA head 01/28/18 IMPRESSION: 1. No acute intracranial finding including infarct. 2. Bilateral partial mastoid opacification. Mild mucosal thickening in the paranasal sinuses. 3. Mild white matter disease with mild progression from 2016. The pattern of involvement is nonspecific, history suggests chronic microvascular ischemia. 4. 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    Assessment: Encounter Diagnoses  Name Primary?  . Chronic sinusitis, unspecified location Yes  . Chronic otitis media, unspecified otitis media type   . Chronic vertigo   . Mild intermittent asthma without complication   . Not currently working due to disabled status   . Obesity without serious comorbidity, unspecified classification, unspecified obesity type   . Essential hypertension   . Mild intermittent asthmatic bronchitis without complication      Plan: I reviewed her visits here with Dr. Redmond School back in January as well as a 2 recent emergency department visit notes.  I reviewed her CT and MRIs from the emergency department visit back in February.    Chronic sinusitis, chronic otitis media-Begin a round of Augmentin antibiotic.  She will use Coricidin HBP over-the-counter for congestion, discussed supportive care, rest, hydration.  Diflucan given at her request for possible antibiotic related yeast infection that may occur.  Asthma- not controlled, stop Flovent, begin trial of Advair.  Discussed proper use of medication, risk and benefits of medication.  Continue albuterol as needed.  Discussed the role of preventative versus rescue inhaler.  Brain aneurysm-follow-up with neurosurgery yearly as planned  Hypertension-continue current medication  I completed her Handicap placard form today  Zettie was seen  today for follow-up.  Diagnoses and all orders for this visit:  Chronic sinusitis, unspecified location  Chronic otitis media, unspecified otitis media type  Chronic vertigo  Mild intermittent asthma without complication  Not currently working due to disabled status  Obesity without serious comorbidity, unspecified classification, unspecified obesity type  Essential hypertension  Mild intermittent asthmatic bronchitis without complication -     albuterol (PROVENTIL HFA;VENTOLIN HFA) 108 (90 Base) MCG/ACT inhaler; Inhale 2 puffs into the lungs every 4 (four) hours as needed for wheezing or shortness of breath.  Other orders -     amoxicillin-clavulanate (AUGMENTIN) 875-125 MG tablet; Take 1 tablet by mouth 2 (two) times daily. -     fluconazole (DIFLUCAN) 150 MG tablet; Take 1 tablet (150 mg total) by mouth once for 1 dose. -     Fluticasone-Salmeterol (ADVAIR DISKUS) 250-50 MCG/DOSE AEPB; Inhale 1 puff into the lungs 2 (two) times daily.   F/ u 72mo with Dr. Redmond School.

## 2018-02-19 ENCOUNTER — Telehealth: Payer: Self-pay

## 2018-02-19 ENCOUNTER — Telehealth: Payer: Self-pay | Admitting: Medical

## 2018-02-19 ENCOUNTER — Other Ambulatory Visit: Payer: Self-pay

## 2018-02-19 MED ORDER — FLUTICASONE FUROATE-VILANTEROL 100-25 MCG/INH IN AEPB: 1.0000 | INHALATION_SPRAY | Freq: Every day | RESPIRATORY_TRACT | 2 refills | Status: DC

## 2018-02-19 NOTE — Telephone Encounter (Signed)
Pt was called tro let her know that her insurance will not cover the advir and a new script was sent in or breo 100/54mcg. Thanks [employer]

## 2018-02-19 NOTE — Telephone Encounter (Signed)
P.A. ADVAIR completed over the [id number] ref# [id number].  I called pt and she is almost out of medication.   Ok give another sample per [place].

## 2018-03-05 NOTE — Telephone Encounter (Signed)
Per next telephone call (Pt was called to let her know that her insurance will not cover the advair and a new script was sent in for breo 100/77mcg. Thanks [place])  I called pharmacy and Breo went thru for $3

## 2018-03-06 ENCOUNTER — Ambulatory Visit: Payer: Medicare Other | Admitting: Family Medicine

## 2018-03-06 ENCOUNTER — Encounter: Payer: Self-pay | Admitting: Family Medicine

## 2018-03-06 ENCOUNTER — Ambulatory Visit (INDEPENDENT_AMBULATORY_CARE_PROVIDER_SITE_OTHER): Payer: Medicare Other | Admitting: Family Medicine

## 2018-03-06 VITALS — BP 138/88 | HR 85 | Wt 261.0 lb

## 2018-03-06 DIAGNOSIS — J4520 Mild intermittent asthma, uncomplicated: Secondary | ICD-10-CM

## 2018-03-06 DIAGNOSIS — R42 Dizziness and giddiness: Secondary | ICD-10-CM | POA: Diagnosis not present

## 2018-03-06 DIAGNOSIS — H6690 Otitis media, unspecified, unspecified ear: Secondary | ICD-10-CM | POA: Diagnosis not present

## 2018-03-06 MED ORDER — MECLIZINE HCL 12.5 MG PO TABS: 12.5000 mg | ORAL_TABLET | Freq: Three times a day (TID) | ORAL | 0 refills | Status: DC | PRN

## 2018-03-06 MED ORDER — AMOXICILLIN-POT CLAVULANATE 875-125 MG PO TABS: 1.0000 | ORAL_TABLET | Freq: Two times a day (BID) | ORAL | 0 refills | Status: DC

## 2018-03-06 MED ORDER — FLUCONAZOLE 150 MG PO TABS: 150.0000 mg | ORAL_TABLET | Freq: Once | ORAL | 0 refills | Status: AC

## 2018-03-06 NOTE — Progress Notes (Signed)
   Subjective:    Patient ID: Stephanie Best, female    DOB: [birth year], 57 y.o.   MRN: [id number]  HPI She is here for a recheck.  She presently is on 3 inhalers but is unsure of exactly which 3 she is taking.  She states that she does use the albuterol inhaler 2 or 3 times per week but is unsure of the names of the other ones.  She continues have difficulty with dizziness and does need a refill on her meclizine.  No fever, chills, sore throat, cough or congestion she also has difficulty with hearing from both ears.  She was given Augmentin but finished it approximately 2 weeks ago.  She states that she is roughly 50% better.   Review of Systems     Objective:   Physical Exam Alert and in no distress.  Both tympanic membranes showed poor landmarks with some erythema and retraction.  She will call me with the names of the inhalers that she is taking and I would readjust appropriately.  Pharyngeal area is normal. Neck is supple without adenopathy or thyromegaly. Cardiac exam shows a regular sinus rhythm without murmurs or gallops. Lungs are clear to auscultation.        Assessment & Plan:  Chronic otitis media, unspecified otitis media type - Plan: amoxicillin-clavulanate (AUGMENTIN) 875-125 MG tablet  Chronic vertigo - Plan: meclizine (ANTIVERT) 12.5 MG tablet  Mild intermittent asthma without complication Diflucan also given to help with vaginitis from the antibiotic. She is to call me at the end of 2 weeks to let me know how her ears are doing.  I might give her another 2-week refill she is making an improvement.  Definitely need to see her again as her tympanic membranes were not normal.

## 2018-03-06 NOTE — Patient Instructions (Signed)
Take all the antibiotic and call me when you finish and let me know how you are doing also call back and give me all the inhalers

## 2018-03-07 ENCOUNTER — Telehealth: Payer: Self-pay | Admitting: Family Medicine

## 2018-03-07 NOTE — Telephone Encounter (Signed)
New Message  Pt verbalized inhaler is Breo by mouth once daily.   Have inhaler of Flonase as needed.   Ventolin 2 puff into lungs as needed.

## 2018-03-10 NOTE — Telephone Encounter (Signed)
She is using Breo and I think Advair.  Find out which one her insurance will cover and have her continue on that one

## 2018-03-11 NOTE — Telephone Encounter (Signed)
Pt was called LVM due to no answer. Thanks [employer]

## 2018-03-12 NOTE — Telephone Encounter (Signed)
Pt was contacted and she need the script for meclizine . Script was fill 03-06-18 by Dr. Redmond School. Pharmacy says they did not get it so meclizine was called in for pt to [facility] on [address]. Thanks [employer]

## 2018-03-20 DIAGNOSIS — I671 Cerebral aneurysm, nonruptured: Secondary | ICD-10-CM

## 2018-03-20 HISTORY — DX: Cerebral aneurysm, nonruptured: I67.1

## 2018-04-11 ENCOUNTER — Encounter: Payer: Self-pay | Admitting: Family Medicine

## 2018-04-17 DIAGNOSIS — I671 Cerebral aneurysm, nonruptured: Secondary | ICD-10-CM | POA: Insufficient documentation

## 2018-07-15 IMAGING — MR MR MRA HEAD W/O CM
9 of 13 series · 31 of 48 positions shown · IV contrast (Yes    MULTIHANCE)
Comparison: 02/23/2017 MRA.  Brain MRI 01/07/2015

CLINICAL DATA: Vertigo, episodic.

EXAM:
MRI HEAD WITHOUT CONTRAST
MRA HEAD WITHOUT CONTRAST
TECHNIQUE: Multiplanar, multiecho pulse sequences of the brain and surrounding
structures were obtained without intravenous contrast. Angiographic
images of the head were obtained using MRA technique without
contrast.

[Series 3: DWI · axial · 3.0mm · 1.09mm/px · z∈[-71,+76]mm · 7 of 100 slices shown (1 of 4)]
[im 1/100]
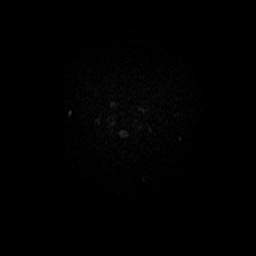
[im 17/100]
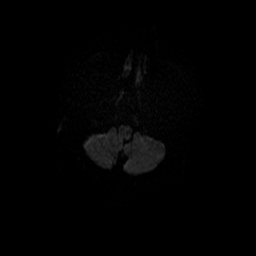
[im 34/100]
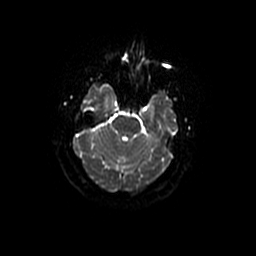
[im 50/100]
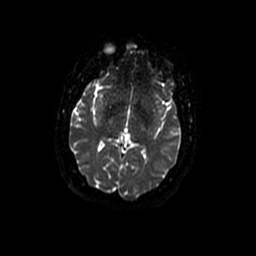
[im 67/100]
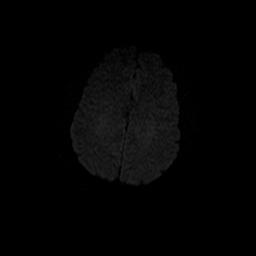
[im 83/100]
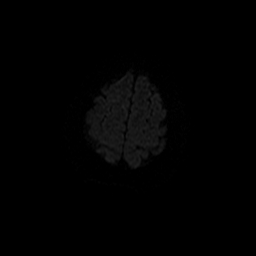
[im 100/100]
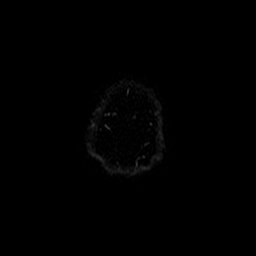

[Series 4: (id) mt fs · axial · 1.4mm · 0.43mm/px · z∈[-73,-21]mm · 5 of 136 slices shown]
[im 1/136]
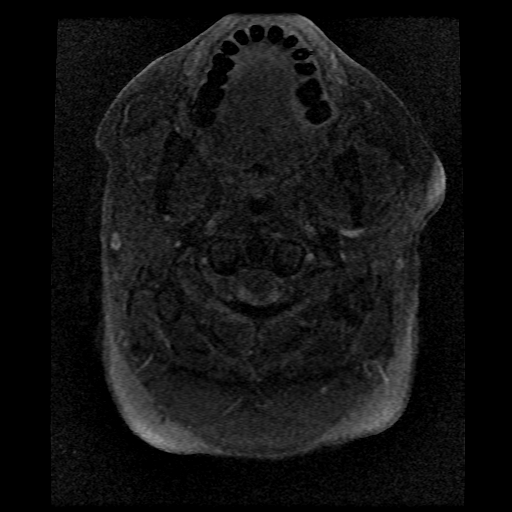
[im 16/136]
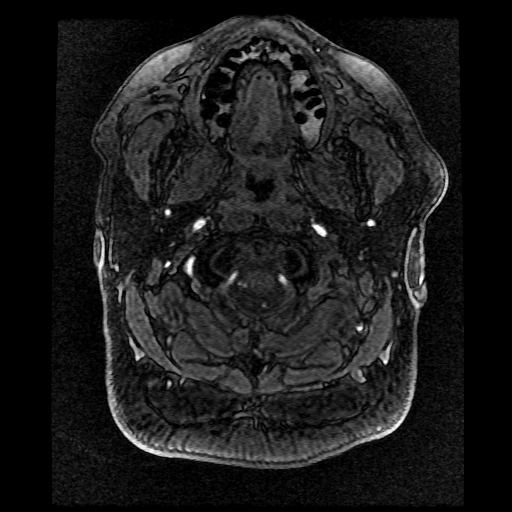
[im 46/136]
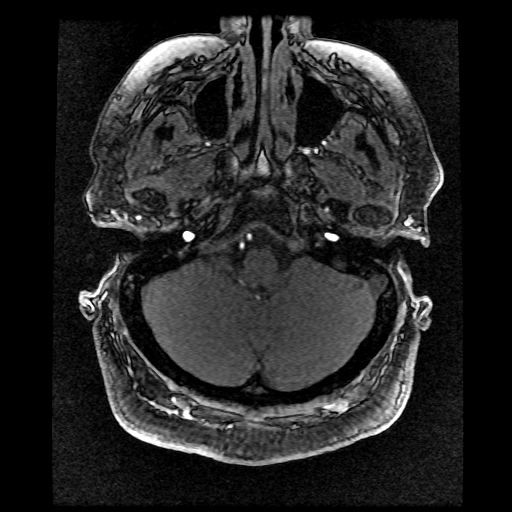
[im 61/136]
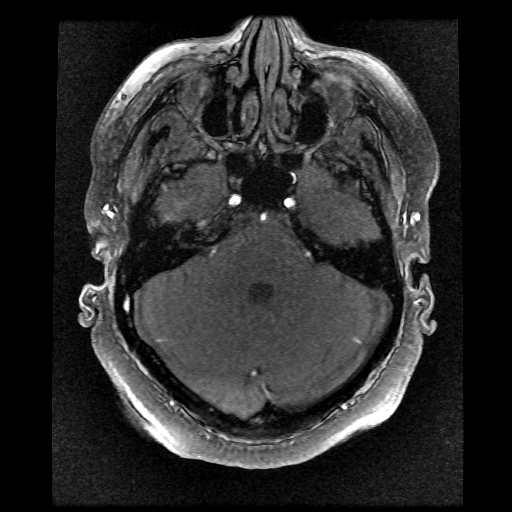
[im 76/136]
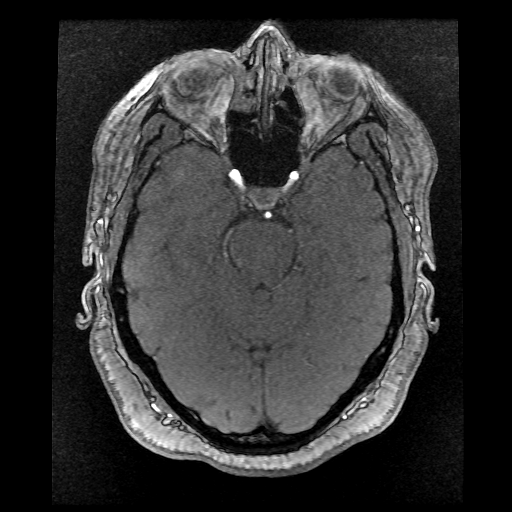

[Series 5: DWI · coronal · 5.0mm · 1.09mm/px · 4 of 68 slices shown (2 of 4)]
[im 1/68]
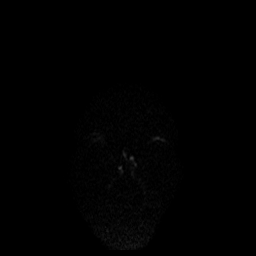
[im 23/68]
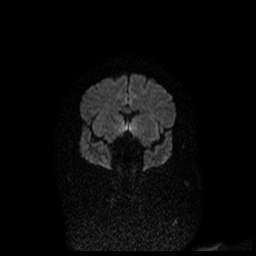
[im 45/68]
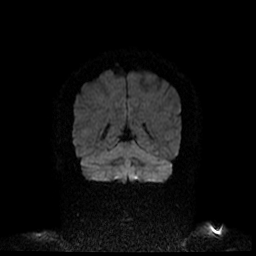
[im 68/68]
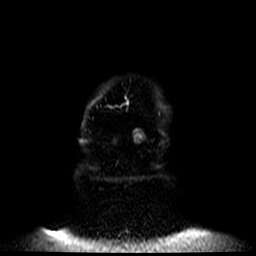

[Series 6: T1 · sagittal · 5.0mm · 0.47mm/px · 2 of 23 slices shown]
[im 1/23]
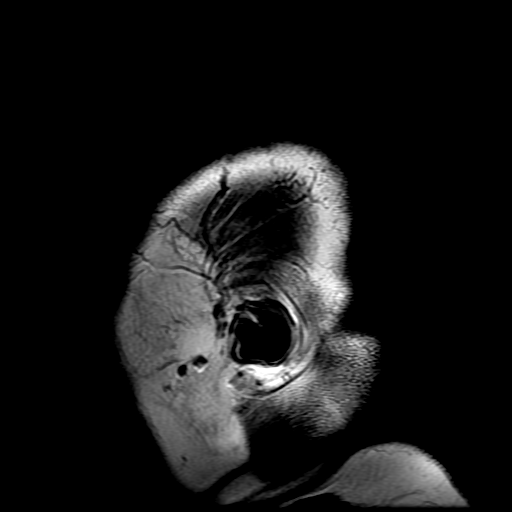
[im 23/23]
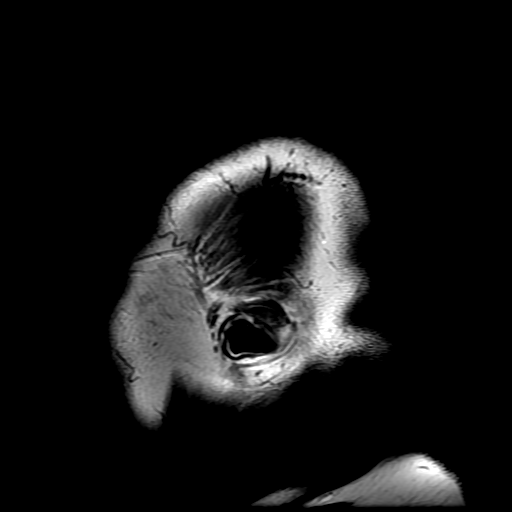

[Series 7: T2 · axial · 5.0mm · 0.43mm/px · z∈[-84,+65]mm · 2 of 26 slices shown (1 of 2)]
[im 1/26]
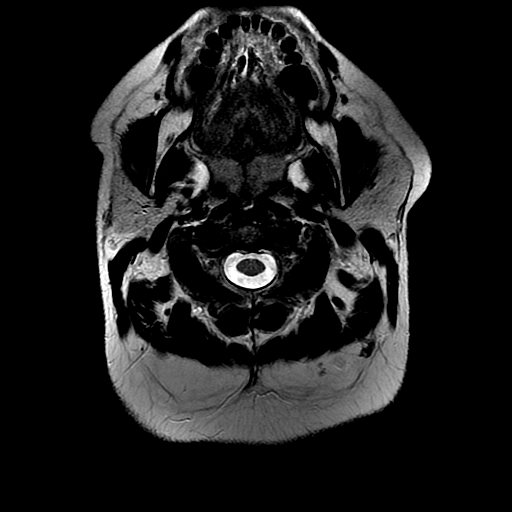
[im 26/26]
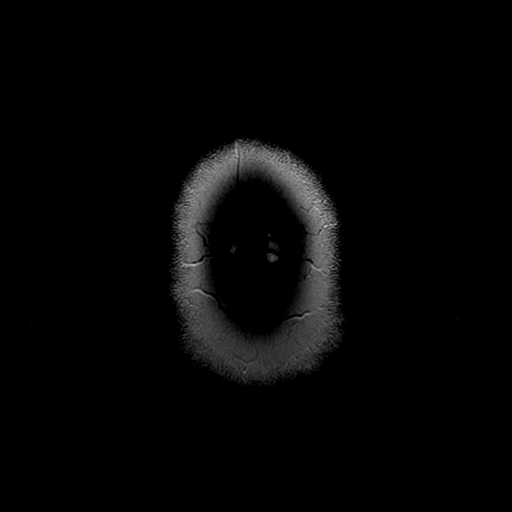

[Series 8: FLAIR · axial · 5.0mm · 0.43mm/px · z∈[-84,+65]mm · 2 of 26 slices shown]
[im 1/26]
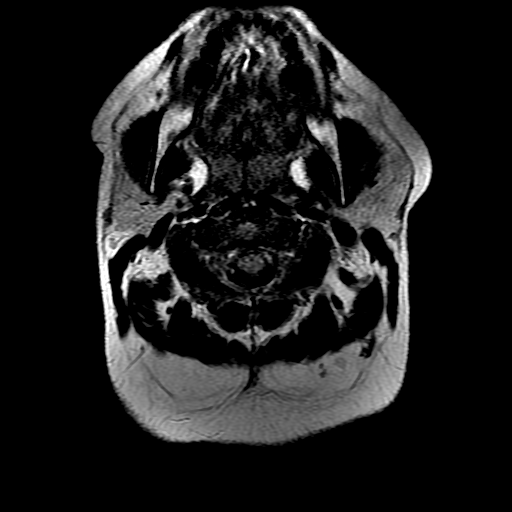
[im 26/26]
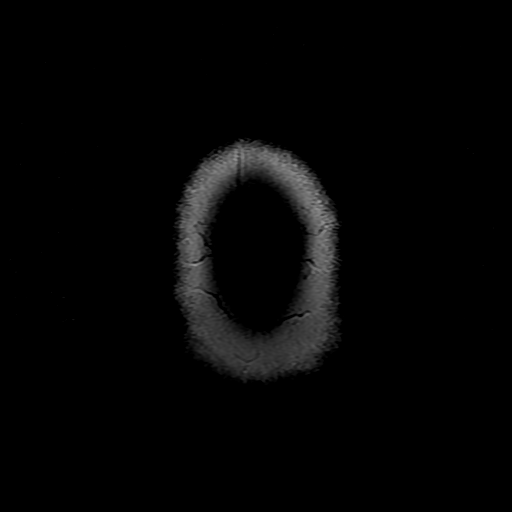

[Series 11: T2 · coronal · 5.0mm · 0.43mm/px · 2 of 29 slices shown (2 of 2)]
[im 1/29]
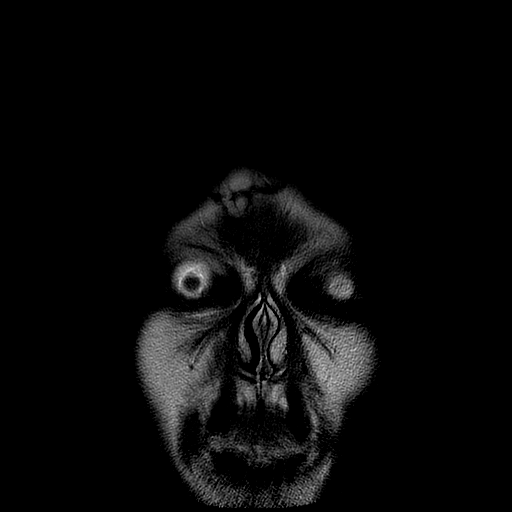
[im 29/29]
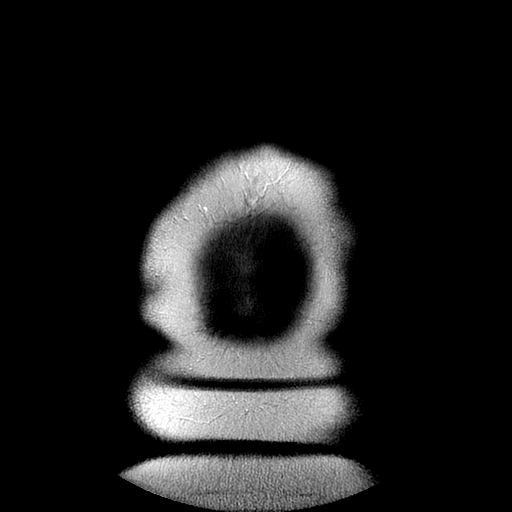

[Series 300: DWI · axial · 3.0mm · 1.09mm/px · z∈[-71,+76]mm · 4 of 50 slices shown (3 of 4)]
[im 1/50]
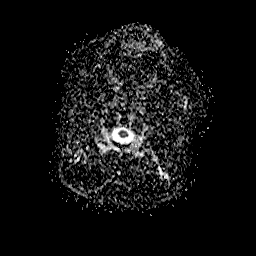
[im 17/50]
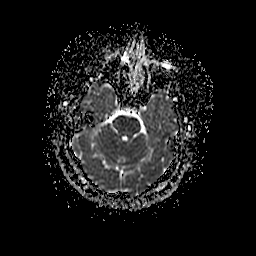
[im 33/50]
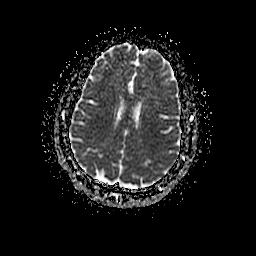
[im 50/50]
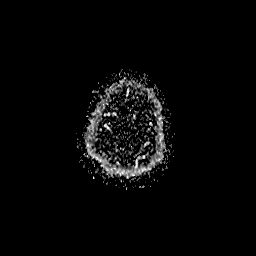

[Series 500: DWI · coronal · 5.0mm · 1.09mm/px · 3 of 34 slices shown (4 of 4)]
[im 1/34]
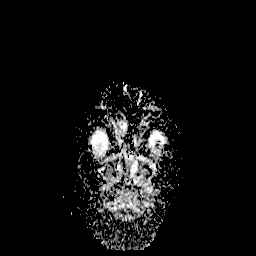
[im 17/34]
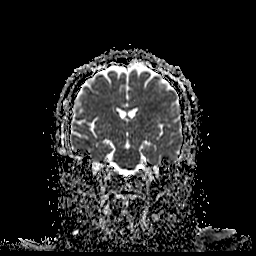
[im 34/34]
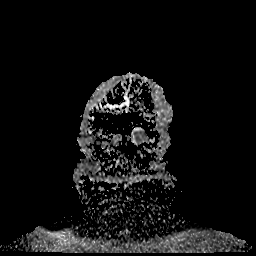

[31 of 48 positions shown; findings below may reference images not displayed]

FINDINGS: MRI HEAD FINDINGS

Brain: No acute infarction, hemorrhage, hydrocephalus, extra-axial
collection or mass lesion. Few FLAIR hyperintense foci in the
cerebral white matter with nonspecific appearance, likely related to
patient's history of hypertension. There has been at least 1 new
focus since 9816.

Vascular: Arterial findings below.

Skull and upper cervical spine: No evidence of marrow lesion.

Sinuses/Orbits: Mild mucosal thickening in the paranasal sinuses.
Partial bilateral mastoid opacification.

Other: Stable probable small lipoma over the right paramedian
forehead with overlying scar on coronal T2 weighted imaging.

MRA HEAD FINDINGS

Carotid vertebral arteries are smooth and diffusely patent. No
branch occlusion, stenosis or beading. Anterior communicating artery
region superiorly directed aneurysm which measures 2 mm base to
dome. No evidence of growth.
IMPRESSION: 1. No acute intracranial finding including infarct.
2. Bilateral partial mastoid opacification. Mild mucosal thickening
in the paranasal sinuses.
3. Mild white matter disease with mild progression from 9816. The
pattern of involvement is nonspecific, history suggests chronic
microvascular ischemia.
4. 2 mm anterior communicating artery region aneurysm without
interval growth.

## 2018-11-15 ENCOUNTER — Ambulatory Visit (INDEPENDENT_AMBULATORY_CARE_PROVIDER_SITE_OTHER): Payer: Medicare Other

## 2018-11-15 ENCOUNTER — Ambulatory Visit (HOSPITAL_COMMUNITY)
Admission: EM | Admit: 2018-11-15 | Discharge: 2018-11-15 | Disposition: A | Discharge status: Home / Self Care | Payer: Medicare Other | Attending: Family Medicine | Admitting: Family Medicine

## 2018-11-15 ENCOUNTER — Encounter (HOSPITAL_COMMUNITY): Payer: Self-pay | Admitting: Emergency Medicine

## 2018-11-15 DIAGNOSIS — M7989 Other specified soft tissue disorders: Secondary | ICD-10-CM | POA: Diagnosis not present

## 2018-11-15 DIAGNOSIS — M79672 Pain in left foot: Secondary | ICD-10-CM | POA: Diagnosis not present

## 2018-11-15 DIAGNOSIS — M779 Enthesopathy, unspecified: Secondary | ICD-10-CM

## 2018-11-15 MED ORDER — MELOXICAM 7.5 MG PO TABS: 7.5000 mg | ORAL_TABLET | Freq: Every day | ORAL | 0 refills | Status: DC

## 2018-11-15 NOTE — Discharge Instructions (Signed)
X-ray negative for fracture or dislocation.  I am treating you for tendon inflammation. Start Mobic. Do not take ibuprofen (motrin/advil)/ naproxen (aleve) while on mobic.  Ice compress, elevation, rest, Ace wrap during activity.  This may take a few weeks to completely resolve, but should be feeling better each week.  Follow-up with PCP for further evaluation if symptoms not improving.

## 2018-11-15 NOTE — ED Triage Notes (Signed)
Pt c/o ongoing L foot pain since October, states she fell down some stairs and hurt her L heel and its still hurting and swollen.

## 2018-11-15 NOTE — ED Provider Notes (Signed)
[place]    CSN: [id number] Arrival date & time: 11/15/18  1443     History   Chief Complaint Chief Complaint  Patient presents with  . Foot Pain    HPI Stephanie Best is a 57 y.o. female.   57 year old female comes in for left foot pain for the past 2 months.  States 2 months ago, felt him some steps, and thinks she hurt her left heel.  Thought it was due to bruising, but for the past 2 months has had increased pain with some swelling.  Denies decrease in range of motion, numbness or tingling.  Has not been taking anything for the symptoms.     Past Medical History:  Diagnosis Date  . Aneurysm of anterior communicating artery 03/20/2018   see  report in chart  . Asthma   . Hypertension   . Obesity   . Vertigo 03/2014    Patient Active Problem List   Diagnosis Date Noted  . Anterior communicating artery aneurysm 04/17/2018  . Chronic vertigo 02/06/2018  . Chronic otitis media 02/06/2018  . Chronic sinusitis 02/06/2018  . Not currently working due to disabled status 01/06/2018  . Personal history of colonic polyps 04/09/2014  . Hypertension 05/15/2011  . Asthma 05/15/2011  . Obesity     Past Surgical History:  Procedure Laterality Date  . ABDOMINAL HYSTERECTOMY    . BREAST REDUCTION SURGERY  1998  . IR GENERIC HISTORICAL  02/24/2015   IR ANGIO INTRA EXTRACRAN SEL COM CAROTID INNOMINATE UNI L MOD SED 02/24/2015 Consuella Lose, MD [facility]  . IR GENERIC HISTORICAL  02/24/2015   IR ANGIO INTRA EXTRACRAN SEL INTERNAL CAROTID UNI R MOD SED 02/24/2015 Consuella Lose, MD [facility]  . IR GENERIC HISTORICAL  02/24/2015   IR ANGIO VERTEBRAL SEL VERTEBRAL BILAT MOD SED 02/24/2015 Consuella Lose, MD [facility]  . KNEE ARTHROSCOPY  1995,06/2008   LEFT,RIGHT  . SHOULDER SURGERY     tumor removal    OB History   None      Home Medications    Prior to Admission medications   Medication Sig Start Date End Date Taking? Authorizing  Provider  albuterol (PROVENTIL HFA;VENTOLIN HFA) 108 (90 Base) MCG/ACT inhaler Inhale 2 puffs into the lungs every 4 (four) hours as needed for wheezing or shortness of breath. 02/06/18   Tysinger, Camelia Eng, PA-C  amoxicillin-clavulanate (AUGMENTIN) 875-125 MG tablet Take 1 tablet by mouth 2 (two) times daily. Patient not taking: Reported on 11/15/2018 03/06/18   Denita Lung, MD  benzonatate (TESSALON) 100 MG capsule Take 1-2 capsules (100-200 mg total) by mouth every 8 (eight) hours. Patient not taking: Reported on 03/06/2018 01/14/18   Tereasa Coop, PA-C  diltiazem [facility]) 240 MG 24 hr capsule Take 1 capsule (240 mg total) by mouth daily. 11/04/17   Denita Lung, MD  fluticasone (FLOVENT HFA) 44 MCG/ACT inhaler Inhale 2 puffs into the lungs 2 (two) times daily. 01/14/18   Tereasa Coop, PA-C  fluticasone furoate-vilanterol (BREO ELLIPTA) 100-25 MCG/INH AEPB Inhale 1 puff into the lungs daily. 02/19/18   Tysinger, Camelia Eng, PA-C  lisinopril-hydrochlorothiazide (PRINZIDE,ZESTORETIC) 20-12.5 MG tablet Take 1 tablet by mouth daily. 11/04/17   Denita Lung, MD  meclizine (ANTIVERT) 12.5 MG tablet Take 1 tablet (12.5 mg total) by mouth 3 (three) times daily as needed for dizziness. 03/06/18   Denita Lung, MD  meloxicam (MOBIC) 7.5 MG tablet Take 1 tablet (7.5 mg total) by mouth  daily. 11/15/18   Tasia Catchings, Promyse Ardito V, PA-C  ondansetron (ZOFRAN ODT) 4 MG disintegrating tablet Take 1 tablet (4 mg total) by mouth every 8 (eight) hours as needed for nausea or vomiting. 01/28/18   Ezequiel Essex, MD    Family History Family History  Problem Relation Age of Onset  . Hypertension Mother   . Cancer Father 15       Stomach cancer  . Stomach cancer Father 11  . Other Son        Killed, 58  . Healthy Daughter        49  . Colon cancer Neg Hx   . Colon polyps Neg Hx   . Rectal cancer Neg Hx     Social History Social History   Tobacco Use  . Smoking status: Never Smoker  . Smokeless tobacco: Never  Used  Substance Use Topics  . Alcohol use: Yes    Alcohol/week: 1.0 standard drinks    Types: 1 Glasses of wine per week    Comment: Rarely  . Drug use: No     Allergies   Patient has no known allergies.   Review of Systems Review of Systems  Reason unable to perform ROS: See HPI as above.     Physical Exam Triage Vital Signs ED Triage Vitals [11/15/18 1654]  Enc Vitals Group     BP (!) 168/72     Pulse Rate 84     Resp 16     Temp 98 F (36.7 C)     Temp src      SpO2 98 %     Weight      Height      Head Circumference      Peak Flow      Pain Score 6     Pain Loc      Pain Edu?      Excl. in [place]?    No data found.  Updated Vital Signs BP (!) 168/72   Pulse 84   Temp 98 F (36.7 C)   Resp 16   SpO2 98%   Physical Exam  Constitutional: She is oriented to person, place, and time. She appears well-developed and well-nourished. No distress.  HENT:  Head: Normocephalic and atraumatic.  Eyes: Pupils are equal, round, and reactive to light. Conjunctivae are normal.  Musculoskeletal:  Swelling inferior to the lateral malleolus of the left foot.  No erythema or warmth.  Tenderness to palpation along Achilles tendon.  No tenderness to palpation of bilateral malleolus, foot.  Full range of motion of ankle and toes.  Strength normal and equal bilaterally.  Sensation intact and equal bilaterally.  Pedal pulse 2+, cap refill less than 2 seconds.  Neurological: She is alert and oriented to person, place, and time.  Skin: She is not diaphoretic.     UC Treatments / Results  Labs (all labs ordered are listed, but only abnormal results are displayed) Labs Reviewed - No data to display  EKG None  Radiology Dg Foot Complete Left  Result Date: 11/15/2018 CLINICAL DATA:  Fall 2 months ago. Foot pain and swelling. Initial encounter. EXAM: LEFT FOOT - COMPLETE 3+ VIEW COMPARISON:  None. FINDINGS: There is no evidence of fracture or dislocation. There is no evidence of  arthropathy. Small plantar calcaneal bone spur noted. Soft tissues are unremarkable. IMPRESSION: No acute findings. Electronically Signed   By: Earle Gell M.D.   On: 11/15/2018 17:56    Procedures Procedures (including critical care  time)  Medications Ordered in UC Medications - No data to display  Initial Impression / Assessment and Plan / UC Course  I have reviewed the triage vital signs and the nursing notes.  Pertinent labs & imaging results that were available during my care of the patient were reviewed by me and considered in my medical decision making (see chart for details).    X-ray negative for fracture or dislocation.  Will treat for tendinitis with Mobic.  Ice compress, elevation, Ace wrap during activity.  Return precautions given.  Patient expresses understanding and agrees to plan.  Final Clinical Impressions(s) / UC Diagnoses   Final diagnoses:  Tendinitis    ED Prescriptions    Medication Sig Dispense Auth. Provider   meloxicam (MOBIC) 7.5 MG tablet Take 1 tablet (7.5 mg total) by mouth daily. 15 tablet Tobin Chad, [place] 11/15/18 1921

## 2018-11-19 DIAGNOSIS — Z1231 Encounter for screening mammogram for malignant neoplasm of breast: Secondary | ICD-10-CM | POA: Diagnosis not present

## 2018-11-19 DIAGNOSIS — Z779 Other contact with and (suspected) exposures hazardous to health: Secondary | ICD-10-CM | POA: Diagnosis not present

## 2018-12-12 ENCOUNTER — Encounter: Payer: Self-pay | Admitting: Internal Medicine

## 2018-12-16 DIAGNOSIS — N951 Menopausal and female climacteric states: Secondary | ICD-10-CM | POA: Diagnosis not present

## 2018-12-23 ENCOUNTER — Ambulatory Visit (INDEPENDENT_AMBULATORY_CARE_PROVIDER_SITE_OTHER): Payer: Medicare Other | Admitting: Family Medicine

## 2018-12-23 ENCOUNTER — Encounter: Payer: Self-pay | Admitting: Family Medicine

## 2018-12-23 VITALS — BP 134/88 | HR 88 | Temp 98.3°F | Wt 261.6 lb

## 2018-12-23 DIAGNOSIS — Z23 Encounter for immunization: Secondary | ICD-10-CM | POA: Diagnosis not present

## 2018-12-23 DIAGNOSIS — G8929 Other chronic pain: Secondary | ICD-10-CM | POA: Diagnosis not present

## 2018-12-23 DIAGNOSIS — M79672 Pain in left foot: Secondary | ICD-10-CM | POA: Diagnosis not present

## 2018-12-23 NOTE — Progress Notes (Signed)
   Subjective:    Patient ID: Stephanie Best, female    DOB: [birth year], 58 y.o.   MRN: [id number]  HPI She is here for evaluation of 2 issues.  One is a rash that she has had on the lower extremities for the last 6 months.  There is no itching or drainage or pain.  The rash is only on her feet.  She has had no bruising, nasal or dental bleeding. In October of this year she apparently fell injuring her left heel and did get x-rays which were negative.  She continues have difficulty with heel pain especially when she walks.   Review of Systems     Objective:   Physical Exam Alert and in no distress.  Exam of both lower extremities does show stippling effect of brownish lesions a few millimeters in size.  Lesions are only on the lower extremities in the ankle area.  No evidence of bruising anywhere else. Left heel exam shows full motion with a small area of thickening of the Achilles tendon in the midportion which is nontender.  She does complain of heel pain on palpation between the calcaneal spur and the Achilles tendon.  Calcaneal spur is nontender.  Full motion of the ankle.      Assessment & Plan:  Need for influenza vaccination - Plan: Flu Vaccine QUAD 6+ mos PF IM (Fluarix Quad PF)  Heel pain, chronic, left I explained that the skin lesions are probably tattooing from hemosiderin but of no major concern. Then discussed the treatment of her heel pain.  Recommend heel stretching as well as heel cups.  If continued difficulty after a month, I can get an x-ray and possibly refer to podiatry.  Was comfortable with that.

## 2018-12-23 NOTE — Patient Instructions (Signed)
Before you get out of bed in the morning do some heel stretching.  Use the heel cups for about a month and then call me.  Then we can get x-rays and possibly refer you

## 2018-12-30 ENCOUNTER — Encounter: Payer: Self-pay | Admitting: Internal Medicine

## 2018-12-30 ENCOUNTER — Other Ambulatory Visit: Payer: Self-pay

## 2018-12-30 ENCOUNTER — Ambulatory Visit (AMBULATORY_SURGERY_CENTER): Payer: Self-pay | Admitting: *Deleted

## 2018-12-30 VITALS — Ht 65.0 in | Wt 265.4 lb

## 2018-12-30 DIAGNOSIS — Z86010 Personal history of colonic polyps: Secondary | ICD-10-CM

## 2018-12-30 MED ORDER — SUPREP BOWEL PREP KIT 17.5-3.13-1.6 GM/177ML PO SOLN: 1.0000 | Freq: Once | ORAL | 0 refills | Status: AC

## 2018-12-30 NOTE — Progress Notes (Signed)
Patient denies any allergies to egg or soy products. Patient denies complications with anesthesia/sedation.  Patient denies oxygen use at home and denies diet medications. Patient denies information on colonoscopy. 

## 2019-01-13 ENCOUNTER — Encounter: Payer: Self-pay | Admitting: Internal Medicine

## 2019-01-13 ENCOUNTER — Ambulatory Visit (AMBULATORY_SURGERY_CENTER): Payer: Medicare Other | Admitting: Internal Medicine

## 2019-01-13 VITALS — BP 150/90 | HR 82 | Temp 97.8°F | Resp 16

## 2019-01-13 DIAGNOSIS — K621 Rectal polyp: Secondary | ICD-10-CM

## 2019-01-13 DIAGNOSIS — K635 Polyp of colon: Secondary | ICD-10-CM | POA: Diagnosis not present

## 2019-01-13 DIAGNOSIS — Z86010 Personal history of colonic polyps: Secondary | ICD-10-CM | POA: Diagnosis not present

## 2019-01-13 DIAGNOSIS — D125 Benign neoplasm of sigmoid colon: Secondary | ICD-10-CM

## 2019-01-13 DIAGNOSIS — D120 Benign neoplasm of cecum: Secondary | ICD-10-CM

## 2019-01-13 DIAGNOSIS — D122 Benign neoplasm of ascending colon: Secondary | ICD-10-CM

## 2019-01-13 DIAGNOSIS — D129 Benign neoplasm of anus and anal canal: Secondary | ICD-10-CM

## 2019-01-13 DIAGNOSIS — D128 Benign neoplasm of rectum: Secondary | ICD-10-CM

## 2019-01-13 MED ORDER — SODIUM CHLORIDE 0.9 % IV SOLN: 500.0000 mL | Freq: Once | INTRAVENOUS | Status: DC

## 2019-01-13 NOTE — Progress Notes (Signed)
Report to PACU, RN, vss, BBS= Clear.  

## 2019-01-13 NOTE — Progress Notes (Signed)
Pt's states no medical or surgical changes since previsit or office visit. 

## 2019-01-13 NOTE — Op Note (Signed)
[place] Patient Name: Stephanie Best Procedure Date: 01/13/2019 9:14 AM MRN: [id number] Endoscopist: Docia Chuck. Henrene Pastor , MD Age: 58 Referring MD:  Date of Birth: Apr 24, 1961 Gender: Female Account #: [id number] Procedure:                Colonoscopy with cold biopsy; colonoscopy with cold                            snare polypectomy x 5 Indications:              High risk colon cancer surveillance: Personal                            history of non-advanced adenoma. Index exam August                            2013 Medicines:                Monitored Anesthesia Care Procedure:                Pre-Anesthesia Assessment:                           - Prior to the procedure, a History and Physical                            was performed, and patient medications and                            allergies were reviewed. The patient's tolerance of                            previous anesthesia was also reviewed. The risks                            and benefits of the procedure and the sedation                            options and risks were discussed with the patient.                            All questions were answered, and informed consent                            was obtained. Prior Anticoagulants: The patient has                            taken no previous anticoagulant or antiplatelet                            agents. ASA Grade Assessment: II - A patient with                            mild systemic disease. After reviewing the risks  and benefits, the patient was deemed in                            satisfactory condition to undergo the procedure.                           After obtaining informed consent, the colonoscope                            was passed under direct vision. Throughout the                            procedure, the patient's blood pressure, pulse, and                            oxygen saturations were monitored continuously. The                             Colonoscope was introduced through the anus and                            advanced to the the cecum, identified by                            appendiceal orifice and ileocecal valve. The                            ileocecal valve, appendiceal orifice, and rectum                            were photographed. The quality of the bowel                            preparation was excellent. The colonoscopy was                            performed without difficulty. The patient tolerated                            the procedure well. The bowel preparation used was                            SUPREP. Scope In: 9:28:06 AM Scope Out: 9:46:33 AM Scope Withdrawal Time: 0 hours 16 minutes 23 seconds  Total Procedure Duration: 0 hours 18 minutes 27 seconds  Findings:                 A less than 1 mm polyp was found in the cecum.                            Biopsies were taken with a cold forceps for                            histology.  Five polyps were found in the rectum, sigmoid                            colon, ascending colon and cecum. The polyps were 1                            to 4 mm in size. These polyps were removed with a                            cold snare. Resection and retrieval were complete.                           Multiple diverticula were found in the left colon                            and right colon.                           The exam was otherwise without abnormality on                            direct and retroflexion views. Complications:            No immediate complications. Estimated blood loss:                            None. Estimated Blood Loss:     Estimated blood loss: none. Impression:               - One less than 1 mm polyp in the cecum. Biopsied                            and removed.                           - Five 1 to 4 mm polyps in the rectum, in the                            sigmoid colon, in  the ascending colon and in the                            cecum, removed with a cold snare. Resected and                            retrieved.                           - Diverticulosis in the left colon and in the right                            colon.                           - The examination was otherwise normal on direct  and retroflexion views. Recommendation:           - Repeat colonoscopy in 3 or 5 years for                            surveillance pending final pathology.                           - Patient has a contact number available for                            emergencies. The signs and symptoms of potential                            delayed complications were discussed with the                            patient. Return to normal activities tomorrow.                            Written discharge instructions were provided to the                            patient.                           - Resume previous diet.                           - Continue present medications.                           - Await pathology results. Docia Chuck. Henrene Pastor, MD 01/13/2019 9:54:13 AM This report has been signed electronically.

## 2019-01-13 NOTE — Patient Instructions (Signed)
Please read handouts provided. Continue present medications. Await pathology results.     YOU HAD AN ENDOSCOPIC PROCEDURE TODAY AT THE [facility]:   Refer to the procedure report that was given to you for any specific questions about what was found during the examination.  If the procedure report does not answer your questions, please call your gastroenterologist to clarify.  If you requested that your care partner not be given the details of your procedure findings, then the procedure report has been included in a sealed envelope for you to review at your convenience later.  YOU SHOULD EXPECT: Some feelings of bloating in the abdomen. Passage of more gas than usual.  Walking can help get rid of the air that was put into your GI tract during the procedure and reduce the bloating. If you had a lower endoscopy (such as a colonoscopy or flexible sigmoidoscopy) you may notice spotting of blood in your stool or on the toilet paper. If you underwent a bowel prep for your procedure, you may not have a normal bowel movement for a few days.  Please Note:  You might notice some irritation and congestion in your nose or some drainage.  This is from the oxygen used during your procedure.  There is no need for concern and it should clear up in a day or so.  SYMPTOMS TO REPORT IMMEDIATELY:   Following lower endoscopy (colonoscopy or flexible sigmoidoscopy):  Excessive amounts of blood in the stool  Significant tenderness or worsening of abdominal pains  Swelling of the abdomen that is new, acute  Fever of 100F or higher    For urgent or emergent issues, a gastroenterologist can be reached at any hour by calling [phone].   DIET:  We do recommend a small meal at first, but then you may proceed to your regular diet.  Drink plenty of fluids but you should avoid alcoholic beverages for 24 hours.  ACTIVITY:  You should plan to take it easy for the rest of today and you should NOT DRIVE  or use heavy machinery until tomorrow (because of the sedation medicines used during the test).    FOLLOW UP: Our staff will call the number listed on your records the next business day following your procedure to check on you and address any questions or concerns that you may have regarding the information given to you following your procedure. If we do not reach you, we will leave a message.  However, if you are feeling well and you are not experiencing any problems, there is no need to return our call.  We will assume that you have returned to your regular daily activities without incident.  If any biopsies were taken you will be contacted by phone or by letter within the next 1-3 weeks.  Please call us at [phone] if you have not heard about the biopsies in 3 weeks.    SIGNATURES/CONFIDENTIALITY: You and/or your care partner have signed paperwork which will be entered into your electronic medical record.  These signatures attest to the fact that that the information above on your After Visit Summary has been reviewed and is understood.  Full responsibility of the confidentiality of this discharge information lies with you and/or your care-partner. 

## 2019-01-14 ENCOUNTER — Telehealth: Payer: Self-pay

## 2019-01-14 ENCOUNTER — Telehealth: Payer: Self-pay | Admitting: *Deleted

## 2019-01-14 NOTE — Telephone Encounter (Signed)
Left message on f/u call 

## 2019-01-14 NOTE — Telephone Encounter (Signed)
  Follow up Call-  Call back number 01/13/2019  Post procedure Call Back phone  # [phone]  Permission to leave phone message Yes  Some recent data might be hidden     Patient questions:  Do you have a fever, pain , or abdominal swelling? No. Pain Score  0 *  Have you tolerated food without any problems? Yes.    Have you been able to return to your normal activities? Yes.    Do you have any questions about your discharge instructions: Diet   No. Medications  No. Follow up visit  No.  Do you have questions or concerns about your Care? No.  Actions: * If pain score is 4 or above: No action needed, pain <4.

## 2019-01-16 ENCOUNTER — Encounter: Payer: Self-pay | Admitting: Internal Medicine

## 2019-01-19 ENCOUNTER — Ambulatory Visit (INDEPENDENT_AMBULATORY_CARE_PROVIDER_SITE_OTHER): Payer: Medicare Other | Admitting: Family Medicine

## 2019-01-19 ENCOUNTER — Encounter: Payer: Self-pay | Admitting: Family Medicine

## 2019-01-19 ENCOUNTER — Ambulatory Visit
Admission: RE | Admit: 2019-01-19 | Discharge: 2019-01-19 | Disposition: A | Discharge status: Home / Self Care | Payer: Medicare Other | Source: Ambulatory Visit | Attending: Family Medicine | Admitting: Family Medicine

## 2019-01-19 ENCOUNTER — Other Ambulatory Visit: Payer: Medicare Other

## 2019-01-19 VITALS — BP 138/86 | HR 89 | Temp 98.0°F | Ht 64.0 in | Wt 268.0 lb

## 2019-01-19 DIAGNOSIS — G8929 Other chronic pain: Secondary | ICD-10-CM

## 2019-01-19 DIAGNOSIS — J4520 Mild intermittent asthma, uncomplicated: Secondary | ICD-10-CM | POA: Diagnosis not present

## 2019-01-19 DIAGNOSIS — Z560 Unemployment, unspecified: Secondary | ICD-10-CM

## 2019-01-19 DIAGNOSIS — I10 Essential (primary) hypertension: Secondary | ICD-10-CM

## 2019-01-19 DIAGNOSIS — Z23 Encounter for immunization: Secondary | ICD-10-CM | POA: Diagnosis not present

## 2019-01-19 DIAGNOSIS — Z86010 Personal history of colonic polyps: Secondary | ICD-10-CM

## 2019-01-19 DIAGNOSIS — I671 Cerebral aneurysm, nonruptured: Secondary | ICD-10-CM | POA: Diagnosis not present

## 2019-01-19 DIAGNOSIS — E669 Obesity, unspecified: Secondary | ICD-10-CM

## 2019-01-19 DIAGNOSIS — R42 Dizziness and giddiness: Secondary | ICD-10-CM

## 2019-01-19 DIAGNOSIS — Z638 Other specified problems related to primary support group: Secondary | ICD-10-CM

## 2019-01-19 DIAGNOSIS — Z0000 Encounter for general adult medical examination without abnormal findings: Secondary | ICD-10-CM

## 2019-01-19 DIAGNOSIS — M79672 Pain in left foot: Secondary | ICD-10-CM | POA: Diagnosis not present

## 2019-01-19 MED ORDER — DILTIAZEM HCL ER COATED BEADS 360 MG PO CP24: 360.0000 mg | ORAL_CAPSULE | Freq: Every day | ORAL | 3 refills | Status: DC

## 2019-01-19 MED ORDER — LISINOPRIL-HYDROCHLOROTHIAZIDE 20-12.5 MG PO TABS: 1.0000 | ORAL_TABLET | Freq: Every day | ORAL | 3 refills | Status: DC

## 2019-01-19 MED ORDER — FLUTICASONE FUROATE-VILANTEROL 100-25 MCG/INH IN AEPB: 1.0000 | INHALATION_SPRAY | Freq: Every day | RESPIRATORY_TRACT | 11 refills | Status: AC

## 2019-01-19 NOTE — Progress Notes (Signed)
Stephanie Best is a 58 y.o. female who presents for annual wellness visit and follow-up on chronic medical conditions.  She does admit to be intermittently depressed but relates this to family stress mainly dealing with the fact that her daughter has been living with her for a year and a half but not contributing to the household and expecting her to take care of her grandchild.  At the present time there is no plan on having the daughter move out.  She does have a good job. She continues on Brio Ellipta and occasionally uses albuterol. She is on lisinopril/HCTZ and ~asked him.  Presently she is not taking any hormone replacement.  She is not taking Mobic for her pain.  Rarely uses her Ambien. She was seen recently and had a colonoscopy which did show multiple polyps. She continues to have heel pain and so far conservative care has not helped. She states that she is getting ready to start an exercise program and admits to dietary indiscretion. She sees her gynecologist regularly. 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  She is followed regularly by neurosurgery.  Immunizations and Health Maintenance Immunization History  Administered Date(s) Administered  . Influenza,inj,Quad PF,6+ Mos 12/23/2018  . Tdap 01/20/2004, 04/09/2014   Health Maintenance Due  Topic Date Due  . HIV Screening  07/18/1977    Last Pap smear:12/ 2019 Last mammogram:11/2018 Last colonoscopy:01/13/19 Last DEXA:almost five years ago Dentist:36/2019 Ophtho:over a year Exercise:little walking  Other doctors caring for patient include: Dr. Henrene Pastor GI, Consuella Lose, MD neck and spine, og-gyn Dr. Radene Knee  Advanced directives: No.  Information given.    Depression screen:  See questionnaire below.  Depression screen PHQ 2/9 11/04/2017  Decreased Interest 0  Down, Depressed, Hopeless 0  PHQ - 2 Score 0    Fall Risk Screen: see questionnaire below.  ADL screen:  See questionnaire  below Functional Status Survey:     Review of Systems Constitutional: -, -unexpected weight change, -anorexia, -fatigue Allergy: -sneezing, -itching, -congestion Dermatology: denies changing moles, rash, lumps ENT: -runny nose, -ear pain, -sore throat,  Cardiology:  -chest pain, -palpitations, -orthopnea, Respiratory: -cough, -shortness of breath, -dyspnea on exertion, -wheezing,  Gastroenterology: -abdominal pain, -nausea, -vomiting, -diarrhea, -constipation, -dysphagia Hematology: -bleeding or bruising problems Musculoskeletal: -arthralgias, -myalgias, -joint swelling, -back pain, - Ophthalmology: -vision changes,  Urology: -dysuria, -difficulty urinating,  -urinary frequency, -urgency, incontinence Neurology: -, -numbness, , -memory loss, -falls, -dizziness    PHYSICAL EXAM:   General Appearance: Alert, cooperative, no distress, appears stated age Head: Normocephalic, without obvious abnormality, atraumatic Eyes: PERRL, conjunctiva/corneas clear, EOM's intact, fundi benign Ears: Normal TM's and external ear canals Nose: Nares normal, mucosa normal, no drainage or sinus tenderness Throat: Lips, mucosa, and tongue normal; teeth and gums normal Neck: Supple, no lymphadenopathy;  thyroid:  no enlargement/tenderness/nodules; no carotid bruit or JVD Lungs: Clear to auscultation bilaterally without wheezes, rales or ronchi; respirations unlabored Heart: Regular rate and rhythm, S1 and S2 normal, no murmur, rubor gallop Abdomen: Soft, non-tender, nondistended, normoactive bowel sounds,  no masses, no hepatosplenomegaly Extremities: No clubbing, cyanosis or edema Pulses: 2+ and symmetric all extremities Skin:  Skin color, texture, turgor normal, no rashes or lesions Lymph nodes: Cervical, supraclavicular, and axillary nodes normal Neurologic:  CNII-XII intact, normal strength, sensation and gait; reflexes 2+ and symmetric throughout Psych: Normal mood, affect, hygiene and  grooming.  ASSESSMENT/PLAN: Need for vaccination for Strep pneumoniae - Plan: Pneumococcal conjugate vaccine 13-valent  Heel pain, chronic,  left  Mild intermittent asthma without complication  Obesity without serious comorbidity, unspecified classification, unspecified obesity type  Anterior communicating artery aneurysm  Chronic vertigo  Essential hypertension  Not currently working due to disabled status  Personal history of colonic polyps  Stress due to family tension Discussed the family stress and tension that she is under.  Recommend that she call family and children services.  Also strongly encouraged her to work towards getting her daughter out on her own rather than relying upon her to provide housing for herself and for her child. Diltiazem increased.  Recheck here in 1 month. Discussed  yearly mammograms; at least 30 minutes of aerobic activity at least 5 days/week and weight-bearing exercise 2x/week;  healthy diet, including goals of calcium and vitamin D intake and alcohol recommendations (less than or equal to 1 drink/day) reviewed; regular seatbelt use; changing batteries in smoke detectors.  Immunization recommendations discussed.  Colonoscopy recommendations reviewed   Medicare Attestation I have personally reviewed: The patient's medical and social history Their use of alcohol, tobacco or illicit drugs Their current medications and supplements The patient's functional ability including ADLs,fall risks, home safety risks, cognitive, and hearing and visual impairment Diet and physical activities Evidence for depression or mood disorders  The patient's weight, height, and BMI have been recorded in the chart.  I have made referrals, counseling, and provided education to the patient based on review of the above and I have provided the patient with a written personalized care plan for preventive services.     Jill Alexanders, MD   01/19/2019

## 2019-01-19 NOTE — Patient Instructions (Addendum)
Call family and children's services for some counseling Use the serenity prayer Look at making changes to your eating habits specifically cutting back on carbohydrates.  The easiest way to remember that is cut back on white food 20 minutes of something physical daily

## 2019-01-20 ENCOUNTER — Other Ambulatory Visit: Payer: Self-pay

## 2019-01-20 DIAGNOSIS — M79672 Pain in left foot: Principal | ICD-10-CM

## 2019-01-20 DIAGNOSIS — G8929 Other chronic pain: Secondary | ICD-10-CM

## 2019-01-20 LAB — COMPREHENSIVE METABOLIC PANEL
ALT: 19 IU/L (ref 0–32)
AST: 21 IU/L (ref 0–40)
Albumin/Globulin Ratio: 1.5 (ref 1.2–2.2)
Albumin: 4 g/dL (ref 3.8–4.9)
Alkaline Phosphatase: 92 IU/L (ref 39–117)
BUN / CREAT RATIO: 13 (ref 9–23)
BUN: 10 mg/dL (ref 6–24)
Bilirubin Total: <0.2 mg/dL (ref 0.0–1.2)
CALCIUM: 9.2 mg/dL (ref 8.7–10.2)
CO2: 23 mmol/L (ref 20–29)
Chloride: 102 mmol/L (ref 96–106)
Creatinine, Ser: 0.79 mg/dL (ref 0.57–1.00)
GFR, EST AFRICAN AMERICAN: 97 mL/min/{1.73_m2} (ref >59)
GFR, EST NON AFRICAN AMERICAN: 84 mL/min/{1.73_m2} (ref >59)
Globulin, Total: 2.6 g/dL (ref 1.5–4.5)
Glucose: 96 mg/dL (ref 65–99)
Potassium: 4.1 mmol/L (ref 3.5–5.2)
Sodium: 142 mmol/L (ref 134–144)
TOTAL PROTEIN: 6.6 g/dL (ref 6.0–8.5)

## 2019-01-20 LAB — CBC WITH DIFFERENTIAL/PLATELET
BASOS: 0 %
Basophils Absolute: 0 10*3/uL (ref 0.0–0.2)
EOS (ABSOLUTE): 0.1 10*3/uL (ref 0.0–0.4)
Eos: 1 %
Hematocrit: 38.8 % (ref 34.0–46.6)
Hemoglobin: 12.5 g/dL (ref 11.1–15.9)
Immature Grans (Abs): 0 10*3/uL (ref 0.0–0.1)
Immature Granulocytes: 0 %
Lymphocytes Absolute: 2.3 10*3/uL (ref 0.7–3.1)
Lymphs: 33 %
MCH: 25.8 pg — ABNORMAL LOW (ref 26.6–33.0)
MCHC: 32.2 g/dL (ref 31.5–35.7)
MCV: 80 fL (ref 79–97)
Monocytes Absolute: 0.5 10*3/uL (ref 0.1–0.9)
Monocytes: 7 %
Neutrophils Absolute: 4.1 10*3/uL (ref 1.4–7.0)
Neutrophils: 59 %
Platelets: 318 10*3/uL (ref 150–450)
RBC: 4.84 x10E6/uL (ref 3.77–5.28)
RDW: 14.3 % (ref 11.7–15.4)
WBC: 6.9 10*3/uL (ref 3.4–10.8)

## 2019-01-20 LAB — LIPID PANEL
CHOL/HDL RATIO: 3 ratio (ref 0.0–4.4)
Cholesterol, Total: 167 mg/dL (ref 100–199)
HDL: 56 mg/dL (ref >39)
LDL Calculated: 94 mg/dL (ref 0–99)
Triglycerides: 87 mg/dL (ref 0–149)
VLDL Cholesterol Cal: 17 mg/dL (ref 5–40)

## 2019-01-22 ENCOUNTER — Encounter (INDEPENDENT_AMBULATORY_CARE_PROVIDER_SITE_OTHER): Payer: Self-pay | Admitting: Family Medicine

## 2019-01-22 ENCOUNTER — Ambulatory Visit (INDEPENDENT_AMBULATORY_CARE_PROVIDER_SITE_OTHER): Payer: Medicare Other | Admitting: Family Medicine

## 2019-01-22 DIAGNOSIS — G8929 Other chronic pain: Secondary | ICD-10-CM | POA: Diagnosis not present

## 2019-01-22 DIAGNOSIS — M79672 Pain in left foot: Secondary | ICD-10-CM | POA: Diagnosis not present

## 2019-01-22 NOTE — Progress Notes (Signed)
Office Visit Note   Patient: Stephanie Best           Date of Birth: 05/28/1961           MRN: [id number] Visit Date: 01/22/2019 Requested by: Denita Lung, MD [place], [place] [id number] PCP: Denita Lung, MD  Subjective: Chief Complaint  Patient presents with  . Left Foot - Pain    Pain plantar heel since October 2019, after a fall. Worse when first stands after sitting or lying down a while. Swells and "feels hot" at times.    HPI: She is here with left heel pain.  In October she was wearing flip-flops and slipped, she fell backward and her posterior heel hit against the ground.  She has had pain since then with weightbearing.  She has tried stretching and anti-inflammatories with no improvement.  She had x-rays in December and again a couple days ago, both of which did not show any acute abnormality.  He now presents for further evaluation.  She has had plantar fasciitis in the past, this pain feels totally different.                ROS: She has morbid obesity, hypertension.  She has never been diagnosed with vitamin D deficiency.  Other systems were reviewed and are negative.  Objective: Vital Signs: There were no vitals taken for this visit.  Physical Exam:  Left heel: No significant swelling or bruising.  There is some tenderness at the mid Achilles tendon but it does not reproduce her pain completely.  Most of her tenderness is on the posterior lateral aspect of the calcaneus.  No pain at the plantar fascia origin.  No pain with flexion of the toes against resistance, ankle plantarflexion or eversion.  Imaging: X-rays from 2 days ago reviewed on computer show early traction spur at the Achilles insertion and the plantar fascia origin on the calcaneus.  No obvious stress fracture compared to December x-rays.  She has midfoot DJD.  Assessment & Plan: 1.  Persistent left heel pain status post fall in October, suspect bone bruise of  calcaneus. -Discussed options with her and elected to try a fracture boot for immobilization with walking, remove the boot daily to work on stretching. -Take vitamin D3 and vitamin K2 along with her magnesium. -Follow-up in about 3 to 4 weeks for recheck, if not making any progress we will order MRI scan.   Follow-Up Instructions: No follow-ups on file.      Procedures: No procedures performed  No notes on file    PMFS History: Patient Active Problem List   Diagnosis Date Noted  . Anterior communicating artery aneurysm 04/17/2018  . Chronic vertigo 02/06/2018  . Not currently working due to disabled status 01/06/2018  . Personal history of colonic polyps 04/09/2014  . Hypertension 05/15/2011  . Asthma 05/15/2011  . Obesity    Past Medical History:  Diagnosis Date  . Allergy   . 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 Asthma   . Depression    no meds  . Hypertension   . Obesity   . Vertigo 03/2014    Family History  Problem Relation Age of Onset  . Hypertension Mother   . Cancer Father 29       Stomach cancer  . Stomach cancer Father 72  . Other Son        Killed,  21  . Healthy Daughter        52  . Colon cancer Neg Hx   . Colon polyps Neg Hx   . Rectal cancer Neg Hx     Past Surgical History:  Procedure Laterality Date  . ABDOMINAL HYSTERECTOMY    . BREAST REDUCTION SURGERY  1998  . CESAREAN SECTION     x 2  . COLONOSCOPY  07/2012   Henrene Pastor polyps  . IR GENERIC HISTORICAL  02/24/2015   IR ANGIO INTRA EXTRACRAN SEL COM CAROTID INNOMINATE UNI L MOD SED 02/24/2015 Consuella Lose, [facility]  . IR GENERIC HISTORICAL  02/24/2015   IR ANGIO INTRA EXTRACRAN SEL INTERNAL CAROTID UNI R MOD SED 02/24/2015 Consuella Lose, [facility]  . IR GENERIC HISTORICAL  02/24/2015   IR ANGIO VERTEBRAL SEL VERTEBRAL BILAT MOD SED 02/24/2015 Consuella Lose, [facility]  . KNEE ARTHROSCOPY  Bilateral 1995,06/2008   x 5  . SHOULDER SURGERY     tumor removal  . WISDOM TOOTH EXTRACTION     Social History   Occupational History  . Not on file  Tobacco Use  . Smoking status: Never Smoker  . Smokeless tobacco: Never Used  Substance and Sexual Activity  . Alcohol use: Yes    Alcohol/week: 1.0 standard drinks    Types: 1 Glasses of wine per week    Comment: Rarely wine  . Drug use: No  . Sexual activity: Not Currently    Birth control/protection: Post-menopausal

## 2019-01-22 NOTE — Patient Instructions (Signed)
   Vitamin D3:  5,000 IU daily for the next few months.  Vitamin K2:  100 mcg daily for the next few months.

## 2019-02-11 ENCOUNTER — Ambulatory Visit (INDEPENDENT_AMBULATORY_CARE_PROVIDER_SITE_OTHER): Payer: Medicare Other | Admitting: Family Medicine

## 2019-02-11 ENCOUNTER — Encounter (INDEPENDENT_AMBULATORY_CARE_PROVIDER_SITE_OTHER): Payer: Self-pay | Admitting: Family Medicine

## 2019-02-11 DIAGNOSIS — G8929 Other chronic pain: Secondary | ICD-10-CM | POA: Diagnosis not present

## 2019-02-11 DIAGNOSIS — M79672 Pain in left foot: Secondary | ICD-10-CM | POA: Diagnosis not present

## 2019-02-11 NOTE — Progress Notes (Signed)
Office Visit Note   Patient: Stephanie Best           Date of Birth: Sep 28, 1961           MRN: [id number] Visit Date: 02/11/2019 Requested by: Denita Lung, MD [place], [place] [address] PCP: Denita Lung, MD  Subjective: Chief Complaint  Patient presents with  . Left Foot - Pain    No improvement in the heel pain. Hurts even with wearing the cam walker.    HPI: She is here with persistent left posterior heel pain.  Original injury in October.  Last visit we gave her a fracture boot and she started taking vitamin D, vitamin K2 and magnesium.  She has not noticed any improvement in her pain.  Continued pain on the posterior heel, it aches and throbs after being on her feet.              ROS: Noncontributory  Objective: Vital Signs: There were no vitals taken for this visit.  Physical Exam:  Left heel: She is still moderately tender to palpation of the posterior calcaneus.  She is also tender at the mid substance of the Achilles tendon and there is slight nodularity on part of it.  No pain with plantarflexion of the ankle against resistance.  Imaging: None today.  Assessment & Plan: 1.  Persistent left heel pain almost 5 months status post contusion -MRI to evaluate.  If no bone contusion, we will try an Aircast Achilles brace and physical therapy referral.  If still showing signs of bone injury, could contemplate bone growth stimulator.     Procedures: No procedures performed  No notes on file     PMFS History: Patient Active Problem List   Diagnosis Date Noted  . Anterior communicating artery aneurysm 04/17/2018  . Chronic vertigo 02/06/2018  . Not currently working due to disabled status 01/06/2018  . Personal history of colonic polyps 04/09/2014  . Hypertension 05/15/2011  . Asthma 05/15/2011  . Obesity    Past Medical History:  Diagnosis Date  . Allergy   . Aneurysm of anterior communicating artery 03/20/2018   see  report in  chart - no current problems  . Asthma   . Depression    no meds  . Hypertension   . Obesity   . Vertigo 03/2014    Family History  Problem Relation Age of Onset  . Hypertension Mother   . Cancer Father 67       Stomach cancer  . Stomach cancer Father 48  . Other Son        Killed, 41  . Healthy Daughter        45  . Colon cancer Neg Hx   . Colon polyps Neg Hx   . Rectal cancer Neg Hx     Past Surgical History:  Procedure Laterality Date  . ABDOMINAL HYSTERECTOMY    . BREAST REDUCTION SURGERY  1998  . CESAREAN SECTION     x 2  . COLONOSCOPY  07/2012   Henrene Pastor polyps  . IR GENERIC HISTORICAL  02/24/2015   IR ANGIO INTRA EXTRACRAN SEL COM CAROTID INNOMINATE UNI L MOD SED 02/24/2015 [facility] Lose, [facility]  . IR GENERIC HISTORICAL  02/24/2015   IR ANGIO INTRA EXTRACRAN SEL INTERNAL CAROTID UNI R MOD SED 02/24/2015 [facility] L[facility], [facility]  . IR GENERIC HISTORICAL  02/24/2015   IR ANGIO VERTEBRAL SEL VERTEBRAL BILAT MOD SED 02/24/2015 Cons[facility]ella Lose, MD  [facility]  . KNEE ARTHROSCOPY Bilateral 1995,06/2008   x 5  . SHOULDER SURGERY     tumor removal  . WISDOM TOOTH EXTRACTION     Social History   Occupational History  . Not on file  Tobacco Use  . Smoking status: Never Smoker  . Smokeless tobacco: Never Used  Substance and Sexual Activity  . Alcohol use: Yes    Alcohol/week: 1.0 standard drinks    Types: 1 Glasses of wine per week    Comment: Rarely wine  . Drug use: No  . Sexual activity: Not Currently    Birth control/protection: Post-menopausal

## 2019-02-12 ENCOUNTER — Ambulatory Visit (INDEPENDENT_AMBULATORY_CARE_PROVIDER_SITE_OTHER): Payer: Medicare Other | Admitting: Family Medicine

## 2019-02-17 ENCOUNTER — Ambulatory Visit: Payer: Medicare Other | Admitting: Family Medicine

## 2019-02-19 ENCOUNTER — Encounter: Payer: Self-pay | Admitting: Family Medicine

## 2019-02-19 ENCOUNTER — Ambulatory Visit (INDEPENDENT_AMBULATORY_CARE_PROVIDER_SITE_OTHER): Payer: Medicare Other | Admitting: Family Medicine

## 2019-02-19 ENCOUNTER — Other Ambulatory Visit: Payer: Self-pay

## 2019-02-19 VITALS — BP 138/84 | HR 93 | Temp 98.3°F | Wt 267.6 lb

## 2019-02-19 DIAGNOSIS — I10 Essential (primary) hypertension: Secondary | ICD-10-CM

## 2019-02-19 DIAGNOSIS — Z638 Other specified problems related to primary support group: Secondary | ICD-10-CM | POA: Diagnosis not present

## 2019-02-19 DIAGNOSIS — E669 Obesity, unspecified: Secondary | ICD-10-CM

## 2019-02-19 NOTE — Progress Notes (Signed)
   Subjective:    Patient ID: Stephanie Best, female    DOB: 1961/06/10, 58 y.o.   MRN: [id number]  HPI She is here for a recheck she is taking her medications regularly but has not signed up to get involved in counseling.  Apparently her daughter has moved out but has not out completely.   Review of Systems     Objective:   Physical Exam Alert and in no distress.  Blood pressure is recorded.       Assessment & Plan:  Obesity without serious comorbidity, unspecified classification, unspecified obesity type  Essential hypertension  Stress due to family tension I again stressed the need for her to be proactive in taking better care of herself in terms of diet and exercise.  I would not make any changes in her blood pressure as she does plan to make diet and exercise changes. I again strongly encouraged her to be more definitive with her daughter and stop enabling her by bailing her out every time.  Recommend that she change the locks on her door and not give her daughter a key.  Recheck here in a couple of months.

## 2019-03-04 ENCOUNTER — Other Ambulatory Visit: Payer: Medicare Other

## 2019-04-15 ENCOUNTER — Telehealth: Payer: Self-pay

## 2019-04-15 NOTE — Telephone Encounter (Signed)
Called pt to advise of option to come into office for f/u per JCL. No answer and screening needs to be done. Also appt was moved up due to office being close . [place]

## 2019-04-21 ENCOUNTER — Ambulatory Visit: Payer: Medicare Other | Admitting: Family Medicine

## 2019-04-21 ENCOUNTER — Ambulatory Visit: Payer: Self-pay | Admitting: Family Medicine

## 2019-04-28 ENCOUNTER — Other Ambulatory Visit: Payer: Self-pay

## 2019-04-28 ENCOUNTER — Ambulatory Visit
Admission: RE | Admit: 2019-04-28 | Discharge: 2019-04-28 | Disposition: A | Discharge status: Home / Self Care | Payer: Medicare Other | Source: Ambulatory Visit | Attending: Family Medicine | Admitting: Family Medicine

## 2019-04-28 DIAGNOSIS — M79672 Pain in left foot: Secondary | ICD-10-CM

## 2019-04-28 DIAGNOSIS — G8929 Other chronic pain: Secondary | ICD-10-CM

## 2019-04-28 DIAGNOSIS — M25572 Pain in left ankle and joints of left foot: Secondary | ICD-10-CM | POA: Diagnosis not present

## 2019-04-29 ENCOUNTER — Telehealth: Payer: Self-pay | Admitting: Family Medicine

## 2019-04-29 DIAGNOSIS — M7662 Achilles tendinitis, left leg: Secondary | ICD-10-CM

## 2019-04-29 MED ORDER — NITROGLYCERIN 0.1 MG/HR TD PT24: MEDICATED_PATCH | TRANSDERMAL | 3 refills | Status: DC

## 2019-04-29 NOTE — Telephone Encounter (Signed)
Left message on patient's mobile voice mail to call back for results of the MRI.

## 2019-04-29 NOTE — Telephone Encounter (Signed)
MRI shows that the Achilles tendon is swollen but not torn. No indication for surgery.  But I think this is the cause of her pain.  I will request referral to physical therapy.  Also I think she'd benefit from an AirCast Achilles brace.  I will call in nitroglycerin patches to apply over the tendon.   Bones of ankle/heel look normal.

## 2019-04-30 NOTE — Telephone Encounter (Signed)
I advised the patient of the results and plan. She will come in tomorrow afternoon at 3 to see me for the Achilles brace.

## 2019-05-01 ENCOUNTER — Other Ambulatory Visit: Payer: Self-pay

## 2019-05-01 ENCOUNTER — Ambulatory Visit (INDEPENDENT_AMBULATORY_CARE_PROVIDER_SITE_OTHER): Payer: Medicare Other

## 2019-05-01 DIAGNOSIS — M7662 Achilles tendinitis, left leg: Secondary | ICD-10-CM

## 2019-05-01 NOTE — Progress Notes (Signed)
The patient came today for fitting of Aircast Achilles heel brace. She did feel some immediate relief with wearing the brace. She will go and get the nitroglycerin patches from the pharmacy today. Awaiting call from PT (for first visit with them).

## 2019-05-10 ENCOUNTER — Telehealth: Payer: Self-pay | Admitting: Adult Health

## 2019-05-10 NOTE — Telephone Encounter (Signed)
Left message for return call regarding recent visit at [facility] office.

## 2019-05-14 ENCOUNTER — Ambulatory Visit: Payer: Self-pay

## 2019-05-14 NOTE — Telephone Encounter (Signed)
Patient called, left VM to return call to [phone] between the hours 7a-7p Monday through Friday. Attempted to advise of the potential exposure to COVID-19 at [place] on 05/01/19 at [facility] and to offer testing.

## 2019-08-06 DIAGNOSIS — H40013 Open angle with borderline findings, low risk, bilateral: Secondary | ICD-10-CM | POA: Diagnosis not present

## 2019-08-06 DIAGNOSIS — H524 Presbyopia: Secondary | ICD-10-CM | POA: Diagnosis not present

## 2019-08-06 DIAGNOSIS — H2513 Age-related nuclear cataract, bilateral: Secondary | ICD-10-CM | POA: Diagnosis not present

## 2019-08-06 DIAGNOSIS — H25013 Cortical age-related cataract, bilateral: Secondary | ICD-10-CM | POA: Diagnosis not present

## 2019-11-25 DIAGNOSIS — U071 COVID-19: Secondary | ICD-10-CM | POA: Diagnosis not present

## 2019-11-25 DIAGNOSIS — R05 Cough: Secondary | ICD-10-CM | POA: Diagnosis not present

## 2019-12-09 ENCOUNTER — Telehealth: Payer: Self-pay

## 2019-12-09 NOTE — Telephone Encounter (Signed)
Explained that ear pain can come from a lot of different sources and it would be best to schedule a visit in the office.

## 2019-12-09 NOTE — Telephone Encounter (Signed)
Pt was advised and she said she would call back on Monday.

## 2019-12-09 NOTE — Telephone Encounter (Signed)
Pt. Called LM stating she thinks she is getting another ear infection having some pain in her ear. She wanted to know if you could call her something in for it, please advise.

## 2019-12-21 DIAGNOSIS — Z779 Other contact with and (suspected) exposures hazardous to health: Secondary | ICD-10-CM | POA: Diagnosis not present

## 2019-12-21 DIAGNOSIS — Z1231 Encounter for screening mammogram for malignant neoplasm of breast: Secondary | ICD-10-CM | POA: Diagnosis not present

## 2020-02-09 ENCOUNTER — Telehealth: Payer: Self-pay

## 2020-02-09 NOTE — Telephone Encounter (Signed)
Per Dr. Redmond School I called pt. To get her scheduled for an apt. Last apt was 02/19/19 f/u visit and last [employer] was 01/19/19 so she is past due for a [place]. Had to [facility] for pt. To call back.

## 2020-06-08 DIAGNOSIS — Z136 Encounter for screening for cardiovascular disorders: Secondary | ICD-10-CM | POA: Diagnosis not present

## 2020-06-08 DIAGNOSIS — I10 Essential (primary) hypertension: Secondary | ICD-10-CM | POA: Diagnosis not present

## 2020-06-08 DIAGNOSIS — J45909 Unspecified asthma, uncomplicated: Secondary | ICD-10-CM | POA: Diagnosis not present

## 2020-06-08 DIAGNOSIS — R9431 Abnormal electrocardiogram [ECG] [EKG]: Secondary | ICD-10-CM | POA: Diagnosis not present

## 2020-06-09 DIAGNOSIS — G4733 Obstructive sleep apnea (adult) (pediatric): Secondary | ICD-10-CM | POA: Diagnosis not present

## 2020-06-09 DIAGNOSIS — I10 Essential (primary) hypertension: Secondary | ICD-10-CM | POA: Diagnosis not present

## 2020-06-09 DIAGNOSIS — R7989 Other specified abnormal findings of blood chemistry: Secondary | ICD-10-CM | POA: Diagnosis not present

## 2020-06-09 DIAGNOSIS — J45909 Unspecified asthma, uncomplicated: Secondary | ICD-10-CM | POA: Diagnosis not present

## 2020-06-09 DIAGNOSIS — R7303 Prediabetes: Secondary | ICD-10-CM | POA: Diagnosis not present

## 2020-06-09 DIAGNOSIS — R9431 Abnormal electrocardiogram [ECG] [EKG]: Secondary | ICD-10-CM | POA: Diagnosis not present

## 2020-06-09 DIAGNOSIS — Z136 Encounter for screening for cardiovascular disorders: Secondary | ICD-10-CM | POA: Diagnosis not present

## 2020-06-09 DIAGNOSIS — Z131 Encounter for screening for diabetes mellitus: Secondary | ICD-10-CM | POA: Diagnosis not present

## 2020-06-10 ENCOUNTER — Other Ambulatory Visit (HOSPITAL_BASED_OUTPATIENT_CLINIC_OR_DEPARTMENT_OTHER): Payer: Self-pay

## 2020-06-10 DIAGNOSIS — G4733 Obstructive sleep apnea (adult) (pediatric): Secondary | ICD-10-CM

## 2020-07-05 ENCOUNTER — Other Ambulatory Visit: Payer: Self-pay

## 2020-07-05 ENCOUNTER — Encounter: Payer: Self-pay | Admitting: Cardiology

## 2020-07-05 ENCOUNTER — Ambulatory Visit: Payer: Medicare Other | Admitting: Cardiology

## 2020-07-05 VITALS — BP 143/63 | HR 75 | Resp 17 | Ht 64.0 in | Wt 251.0 lb

## 2020-07-05 DIAGNOSIS — R9431 Abnormal electrocardiogram [ECG] [EKG]: Secondary | ICD-10-CM

## 2020-07-05 DIAGNOSIS — I10 Essential (primary) hypertension: Secondary | ICD-10-CM | POA: Diagnosis not present

## 2020-07-05 DIAGNOSIS — I671 Cerebral aneurysm, nonruptured: Secondary | ICD-10-CM | POA: Diagnosis not present

## 2020-07-05 NOTE — Progress Notes (Signed)
Date:  07/05/2020   ID:  Stephanie Best, DOB 03-03-1961, MRN [id number]  PCP:  Audley Hose, MD  Cardiologist:  Rex Kras, DO, [facility] (established care 07/05/2020)  REASON FOR CONSULT: Abnormal EKG.   REQUESTING PHYSICIAN:  Denita Lung, MD [address] [place],  [place] [address]  Chief Complaint  Patient presents with  . Hypertension  . Abnormal ECG  . New Patient (Initial Visit)    HPI  [name] is a 59 y.o. female who presents to the office with a chief complaint of "abnormal EKG." She is referred to the office at the request of Denita Lung, MD. Patient's past medical history and cardiovascular risk factors include: Hypertension, history of brain aneurysm, family history of cardiovascular disease, vertigo, obesity due to excess calorie.   Very pleasant African-American female who presents to the office for evaluation of an abnormal EKG.  Patient states that she reestablished care with her primary care provider and had a baseline EKG performed which was interpreted to be abnormal and was referred to the office for further evaluation and management.  Clinically patient denies any chest pain or shortness of breath at rest or with physical exertion.  However, she does not participate in regular structured exercise program or daily routine.  Denies any prior cardiac history.  But she does have multiple cardiovascular risk factors.  Denies prior history of coronary artery disease, myocardial infarction, congestive heart failure, deep venous thrombosis, pulmonary embolism, stroke, transient ischemic attack.  FUNCTIONAL STATUS: No structured exercise program or daily routine.    ALLERGIES: No Known Allergies  MEDICATION LIST PRIOR TO VISIT: Current Meds  Medication Sig  . albuterol (PROVENTIL HFA;VENTOLIN HFA) 108 (90 Base) MCG/ACT inhaler Inhale 2 puffs into the lungs every 4 (four) hours as needed for wheezing or shortness of breath.  Marland Kitchen aspirin EC 81 MG  tablet Take 81 mg by mouth daily. Swallow whole.  . clonazePAM (KLONOPIN) 0.5 MG tablet Take 1 tablet by mouth daily as needed.  . fluticasone furoate-vilanterol (BREO ELLIPTA) 100-25 MCG/INH AEPB Inhale 1 puff into the lungs daily.  Marland Kitchen lisinopril-hydrochlorothiazide (ZESTORETIC) 10-12.5 MG tablet Take 1 tablet by mouth daily.  . meclizine (ANTIVERT) 12.5 MG tablet Take 1 tablet (12.5 mg total) by mouth 3 (three) times daily as needed for dizziness.     PAST MEDICAL HISTORY: Past Medical History:  Diagnosis Date  . Allergy   . 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 Asthma   . Depression    no meds  . Hypertension   . Obesity   . Vertigo 03/2014    PAST SURGICAL HISTORY: Past Surgical History:  Procedure Laterality Date  . ABDOMINAL HYSTERECTOMY    . BREAST REDUCTION SURGERY  1998  . CESAREAN SECTION     x 2  . COLONOSCOPY  07/2012   Henrene Pastor polyps  . IR GENERIC HISTORICAL  02/24/2015   IR ANGIO INTRA EXTRACRAN SEL COM CAROTID INNOMINATE UNI L MOD SED 02/24/2015 Consuella Lose, MD [facility]  . IR GENERIC HISTORICAL  02/24/2015   IR ANGIO INTRA EXTRACRAN SEL INTERNAL CAROTID UNI R MOD SED 02/24/2015 Consuella Lose, MD [facility]  . IR GENERIC HISTORICAL  02/24/2015   IR ANGIO VERTEBRAL SEL VERTEBRAL BILAT MOD SED 02/24/2015 Consuella Lose, [facility]  . KNEE ARTHROSCOPY Bilateral 1995,06/2008   x 5  . SHOULDER SURGERY     tumor removal  .  WISDOM TOOTH EXTRACTION      FAMILY HISTORY: The patient family history includes Cancer (age of onset: 46) in her father; Healthy in her daughter; Hypertension in her mother; Other in her son; Stomach cancer (age of onset: 15) in her father.  SOCIAL HISTORY:  The patient  reports that she has never smoked. She has never used smokeless tobacco. She reports current alcohol use of about 1.0 standard drink of alcohol per week. She reports that she does not use  drugs.  REVIEW OF SYSTEMS: Review of Systems  Constitutional: Negative for chills and fever.  HENT: Negative for hoarse voice and nosebleeds.   Eyes: Negative for discharge, double vision and pain.  Cardiovascular: Negative for chest pain, claudication, dyspnea on exertion, leg swelling, near-syncope, orthopnea, palpitations, paroxysmal nocturnal dyspnea and syncope.  Respiratory: Negative for hemoptysis and shortness of breath.   Musculoskeletal: Negative for muscle cramps and myalgias.  Gastrointestinal: Negative for abdominal pain, constipation, diarrhea, hematemesis, hematochezia, melena, nausea and vomiting.  Neurological: Positive for light-headedness and vertigo. Negative for dizziness.    PHYSICAL EXAM: Vitals with BMI 07/05/2020 02/19/2019 01/19/2019  Height 5' 4" - 5' 4"  Weight 251 lbs 267 lbs 10 oz 268 lbs  BMI 17.79 - 39.03  Systolic 009 233 007  Diastolic 63 84 86  Pulse 75 93 89    CONSTITUTIONAL: Well-developed and well-nourished. No acute distress.  SKIN: Skin is warm and dry. No rash noted. No cyanosis. No pallor. No jaundice HEAD: Normocephalic and atraumatic.  EYES: No scleral icterus MOUTH/THROAT: Moist oral membranes.  NECK: No JVD present. No thyromegaly noted. No carotid bruits  LYMPHATIC: No visible cervical adenopathy.  CHEST Normal respiratory effort. No intercostal retractions  LUNGS: Clear to auscultation bilaterally.  No stridor. No wheezes. No rales.  CARDIOVASCULAR: Regular rate and rhythm, positive S1-S2, no murmurs rubs or gallops appreciated. ABDOMINAL: Obese, soft, nontender, nondistended, positive bowel sounds in all 4 quadrants.  No apparent ascites.  EXTREMITIES: No peripheral edema  HEMATOLOGIC: No significant bruising NEUROLOGIC: Oriented to person, place, and time. Nonfocal. Normal muscle tone.  PSYCHIATRIC: Normal mood and affect. Normal behavior. Cooperative  CARDIAC DATABASE: EKG: 07/05/2020: Normal sinus rhythm, 75 bpm, leftward  axis, poor R wave progression, old anterior infarct, ST-T changes in the high lateral and lateral leads suggestive of possible ischemia, without underlying injury pattern.   Echocardiogram: None  Stress Testing: None  Heart Catheterization: None  LABORATORY DATA: External Labs: Collected: 06/09/2020 Creatinine 0.76 mg/dL. eGFR: 101 mL/min per 1.73 m Lipid profile: Total cholesterol 170, triglycerides 116, HDL 53, LDL 96, non-HDL 117 Hemoglobin A1c: 5.8 TSH: 5.49 and free T4 0.9.   IMPRESSION:    ICD-10-CM   1. Nonspecific abnormal electrocardiogram (ECG) (EKG)  R94.31 EKG 12-Lead    PCV ECHOCARDIOGRAM COMPLETE    PCV MYOCARDIAL PERFUSION WITH LEXISCAN  2. 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 Benign hypertension  I10   4. Class 3 severe obesity due to excess calories without serious comorbidity with body mass index (BMI) of 40.0 to 44.9 in adult [facility]  E66.01    Z68.41      RECOMMENDATIONS: KEIRRA ZEIMET is a 59 y.o. female whose past medical history and cardiac risk factors include: Hypertension, history of brain aneurysm, family history of cardiovascular disease, vertigo, obesity due to excess calorie.   Abnormal EKG:  Patient is noted to have ST-T changes in the high lateral and lateral leads suggestive of ischemia.  Clinically she did not have any  chest pain or shortness of breath.  However, she is not very functionally active either.  Would recommend an ischemic evaluation given her other cardiovascular risk factors.  Echocardiogram will be ordered to evaluate for structural heart disease and left ventricular systolic function.  Nuclear stress test recommended to evaluate for reversible ischemia.  Benign essential hypertension: . Office blood pressure is not at goal.  . Medication reconciled.  . Currently managed by primary care team.  Recommended a goal systolic blood pressure between 120-130 mmHg. . Low salt diet recommended. A diet that is rich in  fruits, vegetables, legumes, and low-fat dairy products and low in snacks, sweets, and meats (such as the Dietary Approaches to Stop Hypertension [DASH] diet).   Obesity, due to excess calories: . Body mass index is 43.08 kg/m. . I reviewed with the patient the importance of diet, regular physical activity/exercise, weight loss.   . Patient is educated on increasing physical activity gradually as tolerated.  With the goal of moderate intensity exercise for 30 minutes a day 5 days a week.  FINAL MEDICATION LIST END OF ENCOUNTER: No orders of the defined types were placed in this encounter.   Current Outpatient Medications:  .  albuterol (PROVENTIL HFA;VENTOLIN HFA) 108 (90 Base) MCG/ACT inhaler, Inhale 2 puffs into the lungs every 4 (four) hours as needed for wheezing or shortness of breath., Disp: 1 Inhaler, Rfl: 0 .  aspirin EC 81 MG tablet, Take 81 mg by mouth daily. Swallow whole., Disp: , Rfl:  .  clonazePAM (KLONOPIN) 0.5 MG tablet, Take 1 tablet by mouth daily as needed., Disp: , Rfl:  .  fluticasone furoate-vilanterol (BREO ELLIPTA) 100-25 MCG/INH AEPB, Inhale 1 puff into the lungs daily., Disp: 60 each, Rfl: 11 .  lisinopril-hydrochlorothiazide (ZESTORETIC) 10-12.5 MG tablet, Take 1 tablet by mouth daily., Disp: , Rfl:  .  meclizine (ANTIVERT) 12.5 MG tablet, Take 1 tablet (12.5 mg total) by mouth 3 (three) times daily as needed for dizziness., Disp: 30 tablet, Rfl: 0  Orders Placed This Encounter  Procedures  . PCV MYOCARDIAL PERFUSION WITH LEXISCAN  . EKG 12-Lead  . PCV ECHOCARDIOGRAM COMPLETE    There are no Patient Instructions on file for this visit.   --Continue cardiac medications as reconciled in final medication list. --Return in about 4 weeks (around 08/02/2020) for review test results.. Or sooner if needed. --Continue follow-up with your primary care physician regarding the management of your other chronic comorbid conditions.  Patient's questions and concerns were  addressed to her satisfaction. She voices understanding of the instructions provided during this encounter.   This note was created using a voice recognition software as a result there may be grammatical errors inadvertently enclosed that do not reflect the nature of this encounter. Every attempt is made to correct such errors.  Rex Kras, [place], [facility]  Pager: [phone] Office: [phone]

## 2020-07-06 ENCOUNTER — Emergency Department (HOSPITAL_COMMUNITY): Payer: Medicare Other

## 2020-07-06 ENCOUNTER — Other Ambulatory Visit: Payer: Self-pay

## 2020-07-06 ENCOUNTER — Encounter (HOSPITAL_COMMUNITY): Payer: Self-pay | Admitting: Pediatrics

## 2020-07-06 ENCOUNTER — Emergency Department (HOSPITAL_COMMUNITY)
Admission: EM | Admit: 2020-07-06 | Discharge: 2020-07-07 | Disposition: A | Discharge status: Home / Self Care | Payer: Medicare Other | Attending: Emergency Medicine | Admitting: Emergency Medicine

## 2020-07-06 DIAGNOSIS — R079 Chest pain, unspecified: Secondary | ICD-10-CM

## 2020-07-06 DIAGNOSIS — I10 Essential (primary) hypertension: Secondary | ICD-10-CM | POA: Diagnosis not present

## 2020-07-06 DIAGNOSIS — K529 Noninfective gastroenteritis and colitis, unspecified: Secondary | ICD-10-CM

## 2020-07-06 DIAGNOSIS — R0602 Shortness of breath: Secondary | ICD-10-CM | POA: Diagnosis not present

## 2020-07-06 DIAGNOSIS — R110 Nausea: Secondary | ICD-10-CM | POA: Diagnosis not present

## 2020-07-06 DIAGNOSIS — E669 Obesity, unspecified: Secondary | ICD-10-CM | POA: Insufficient documentation

## 2020-07-06 DIAGNOSIS — R1013 Epigastric pain: Secondary | ICD-10-CM

## 2020-07-06 DIAGNOSIS — Z6841 Body Mass Index (BMI) 40.0 and over, adult: Secondary | ICD-10-CM | POA: Diagnosis not present

## 2020-07-06 DIAGNOSIS — Z79899 Other long term (current) drug therapy: Secondary | ICD-10-CM | POA: Diagnosis not present

## 2020-07-06 DIAGNOSIS — Z7982 Long term (current) use of aspirin: Secondary | ICD-10-CM | POA: Diagnosis not present

## 2020-07-06 DIAGNOSIS — R0789 Other chest pain: Secondary | ICD-10-CM | POA: Diagnosis not present

## 2020-07-06 DIAGNOSIS — J45909 Unspecified asthma, uncomplicated: Secondary | ICD-10-CM | POA: Diagnosis not present

## 2020-07-06 LAB — CBC
HCT: 43.8 % (ref 36.0–46.0)
Hemoglobin: 13.8 g/dL (ref 12.0–15.0)
MCH: 25.4 pg — ABNORMAL LOW (ref 26.0–34.0)
MCHC: 31.5 g/dL (ref 30.0–36.0)
MCV: 80.5 fL (ref 80.0–100.0)
Platelets: 328 10*3/uL (ref 150–400)
RBC: 5.44 MIL/uL — ABNORMAL HIGH (ref 3.87–5.11)
RDW: 14.8 % (ref 11.5–15.5)
WBC: 9.5 10*3/uL (ref 4.0–10.5)
nRBC: 0 % (ref 0.0–0.2)

## 2020-07-06 LAB — BASIC METABOLIC PANEL
Anion gap: 11 (ref 5–15)
BUN: 14 mg/dL (ref 6–20)
CO2: 28 mmol/L (ref 22–32)
Calcium: 9.3 mg/dL (ref 8.9–10.3)
Chloride: 100 mmol/L (ref 98–111)
Creatinine, Ser: 0.93 mg/dL (ref 0.44–1.00)
GFR calc Af Amer: >60 mL/min (ref >60)
GFR calc non Af Amer: >60 mL/min (ref >60)
Glucose, Bld: 119 mg/dL — ABNORMAL HIGH (ref 70–99)
Potassium: 3.7 mmol/L (ref 3.5–5.1)
Sodium: 139 mmol/L (ref 135–145)

## 2020-07-06 LAB — TROPONIN I (HIGH SENSITIVITY)
Troponin I (High Sensitivity): 27 ng/L — ABNORMAL HIGH (ref <18)
Troponin I (High Sensitivity): 24 ng/L — ABNORMAL HIGH (ref <18)

## 2020-07-06 LAB — I-STAT BETA HCG BLOOD, ED (MC, WL, AP ONLY): I-stat hCG, quantitative: <5 m[IU]/mL (ref <5)

## 2020-07-06 MED ORDER — SODIUM CHLORIDE 0.9% FLUSH
3.0000 mL | Freq: Once | INTRAVENOUS | Status: AC
  Administered 2020-07-07: 3 mL via INTRAVENOUS

## 2020-07-06 NOTE — ED Notes (Signed)
Patient called out stating she felt dizzy. Obtained vitals. Informed charge RN and triage RN of vitals and patient complaint. States patient will be one of next to go back.

## 2020-07-06 NOTE — ED Triage Notes (Signed)
C/o mid epigastric pain started this morning along w/ nausea . 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

## 2020-07-07 ENCOUNTER — Emergency Department (HOSPITAL_COMMUNITY): Payer: Medicare Other

## 2020-07-07 DIAGNOSIS — I10 Essential (primary) hypertension: Secondary | ICD-10-CM | POA: Diagnosis not present

## 2020-07-07 DIAGNOSIS — G4733 Obstructive sleep apnea (adult) (pediatric): Secondary | ICD-10-CM | POA: Diagnosis not present

## 2020-07-07 DIAGNOSIS — R7303 Prediabetes: Secondary | ICD-10-CM | POA: Diagnosis not present

## 2020-07-07 DIAGNOSIS — K529 Noninfective gastroenteritis and colitis, unspecified: Secondary | ICD-10-CM | POA: Diagnosis not present

## 2020-07-07 DIAGNOSIS — R7989 Other specified abnormal findings of blood chemistry: Secondary | ICD-10-CM | POA: Diagnosis not present

## 2020-07-07 DIAGNOSIS — R109 Unspecified abdominal pain: Secondary | ICD-10-CM | POA: Diagnosis not present

## 2020-07-07 LAB — HEPATIC FUNCTION PANEL
ALT: 18 U/L (ref 0–44)
AST: 22 U/L (ref 15–41)
Albumin: 3 g/dL — ABNORMAL LOW (ref 3.5–5.0)
Alkaline Phosphatase: 54 U/L (ref 38–126)
Bilirubin, Direct: 0.2 mg/dL (ref 0.0–0.2)
Indirect Bilirubin: 0.3 mg/dL (ref 0.3–0.9)
Total Bilirubin: 0.5 mg/dL (ref 0.3–1.2)
Total Protein: 5.7 g/dL — ABNORMAL LOW (ref 6.5–8.1)

## 2020-07-07 LAB — LIPASE, BLOOD: Lipase: 29 U/L (ref 11–51)

## 2020-07-07 MED ORDER — SODIUM CHLORIDE 0.9 % IV BOLUS
500.0000 mL | Freq: Once | INTRAVENOUS | Status: AC
  Administered 2020-07-07: 500 mL via INTRAVENOUS

## 2020-07-07 MED ORDER — MORPHINE SULFATE (PF) 4 MG/ML IV SOLN
4.0000 mg | Freq: Once | INTRAVENOUS | Status: AC
  Administered 2020-07-07: 4 mg via INTRAVENOUS
  Filled 2020-07-07: qty 1

## 2020-07-07 MED ORDER — FLUCONAZOLE 150 MG PO TABS
150.0000 mg | ORAL_TABLET | ORAL | 0 refills | Status: AC
Start: 2020-07-07 — End: 2020-07-15

## 2020-07-07 MED ORDER — AMOXICILLIN-POT CLAVULANATE 875-125 MG PO TABS
1.0000 | ORAL_TABLET | Freq: Two times a day (BID) | ORAL | 0 refills | Status: DC
Start: 2020-07-07 — End: 2020-07-20

## 2020-07-07 MED ORDER — AMOXICILLIN-POT CLAVULANATE 875-125 MG PO TABS
1.0000 | ORAL_TABLET | Freq: Once | ORAL | Status: AC
  Administered 2020-07-07: 1 via ORAL
  Filled 2020-07-07: qty 1

## 2020-07-07 MED ORDER — IOHEXOL 300 MG/ML  SOLN
100.0000 mL | Freq: Once | INTRAMUSCULAR | Status: AC | PRN
  Administered 2020-07-07: 100 mL via INTRAVENOUS

## 2020-07-07 MED ORDER — ONDANSETRON HCL 4 MG/2ML IJ SOLN
4.0000 mg | Freq: Once | INTRAMUSCULAR | Status: AC
  Administered 2020-07-07: 4 mg via INTRAVENOUS
  Filled 2020-07-07: qty 2

## 2020-07-07 NOTE — ED Provider Notes (Addendum)
[facility] Provider Note   CSN: [id number] Arrival date & time: 07/06/20  1420     History Chief Complaint  Patient presents with  . Chest Pain    Stephanie Best is a 59 y.o. female.  59 y.o female with a PMH of HTN, Asthma, Obesity presents to the ED with a chief complaint epigastric pain x 24 hours. She describes this pain as intermittent sharp pain to the epigastric region without radiation. The pain is described as severe accompanied by nausea and dry heaving. Last bowel movement today, without any blood in stool. Has not taken any medication for improvement in symptoms. Episode began yesterday after eating hash brown. No history of NSAIDS use, no hx of alcohol use.Reports shortness of breath at baseline and unchanged. No chest pain on today's visit. No fever, vomiting, no blood in stool. No recent travel. Covid vaccines x 2 pfizer. No prior hx of blood clots or headaches. Of note, prior history of hysterectomy.   The history is provided by the patient.  Abdominal Pain Pain location:  Epigastric Pain quality: dull and sharp   Pain radiates to:  Does not radiate Pain severity:  Severe Onset quality:  Sudden Duration:  24 hours Timing:  Intermittent Chronicity:  New Context: not alcohol use, not eating, not recent illness and not suspicious food intake   Relieved by:  Position changes Worsened by:  Movement Ineffective treatments:  None tried Associated symptoms: nausea   Associated symptoms: no chest pain, no constipation, no diarrhea, no fever, no hematemesis, no shortness of breath, no sore throat and no vomiting        Past Medical History:  Diagnosis Date  . Allergy   . 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 Asthma   . Depression    no meds  . Hypertension   . Obesity   . Vertigo 03/2014    Patient Active Problem List   Diagnosis Date Noted  . 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 Chronic vertigo 02/06/2018  . Not currently working due to disabled status 01/06/2018  . Personal history of colonic polyps 04/09/2014  . Hypertension 05/15/2011  . Asthma 05/15/2011  . Obesity     Past Surgical History:  Procedure Laterality Date  . ABDOMINAL HYSTERECTOMY    . BREAST REDUCTION SURGERY  1998  . CESAREAN SECTION     x 2  . COLONOSCOPY  07/2012   Henrene Pastor polyps  . IR GENERIC HISTORICAL  02/24/2015   IR ANGIO INTRA EXTRACRAN SEL COM CAROTID INNOMINATE UNI L MOD SED 02/24/2015 Consuella Lose, MD [facility]  . IR GENERIC HISTORICAL  02/24/2015   IR ANGIO INTRA EXTRACRAN SEL INTERNAL CAROTID UNI R MOD SED 02/24/2015 Consuella Lose, MD [facility]  . IR GENERIC HISTORICAL  02/24/2015   IR ANGIO VERTEBRAL SEL VERTEBRAL BILAT MOD SED 02/24/2015 Consuella Lose, MD [facility]  . KNEE ARTHROSCOPY Bilateral 1995,06/2008   x 5  . SHOULDER SURGERY     tumor removal  . WISDOM TOOTH EXTRACTION       OB History   No obstetric history on file.     Family History  Problem Relation Age of Onset  . Hypertension Mother   . Cancer Father 70       Stomach cancer  . Stomach cancer Father 34  . Other Son        Killed, 24  .  Healthy Daughter        52  . Colon cancer Neg Hx   . Colon polyps Neg Hx   . Rectal cancer Neg Hx     Social History   Tobacco Use  . Smoking status: Never Smoker  . Smokeless tobacco: Never Used  Vaping Use  . Vaping Use: Never used  Substance Use Topics  . Alcohol use: Yes    Alcohol/week: 1.0 standard drink    Types: 1 Glasses of wine per week    Comment: Rarely wine  . Drug use: No    Home Medications Prior to Admission medications   Medication Sig Start Date End Date Taking? Authorizing Provider  albuterol (PROVENTIL HFA;VENTOLIN HFA) 108 (90 Base) MCG/ACT inhaler Inhale 2 puffs into the lungs every 4 (four) hours as needed for wheezing or shortness of breath. 02/06/18  Yes  Tysinger, Camelia Eng, PA-C  aspirin EC 325 MG tablet Take 325 mg by mouth daily.   Yes [provider]  cetirizine (ZYRTEC) 10 MG tablet Take 10 mg by mouth daily as needed for allergies.   Yes [provider]  clonazePAM (KLONOPIN) 0.5 MG tablet Take 1 tablet by mouth daily as needed for anxiety.    Yes [provider]  fluticasone furoate-vilanterol (BREO ELLIPTA) 100-25 MCG/INH AEPB Inhale 1 puff into the lungs daily. Patient taking differently: Inhale 1 puff into the lungs daily as needed (Shortness of breath/Wheezing).  01/19/19  Yes Denita Lung, MD  lisinopril-hydrochlorothiazide (ZESTORETIC) 10-12.5 MG tablet Take 1 tablet by mouth daily.   Yes [provider]  meclizine (ANTIVERT) 12.5 MG tablet Take 1 tablet (12.5 mg total) by mouth 3 (three) times daily as needed for dizziness. 03/06/18  Yes Denita Lung, MD  amoxicillin-clavulanate (AUGMENTIN) 875-125 MG tablet Take 1 tablet by mouth 2 (two) times daily. 07/07/20   Orpah Greek, MD  fluconazole (DIFLUCAN) 150 MG tablet Take 1 tablet (150 mg total) by mouth once a week for 2 doses. 07/07/20 07/15/20  Orpah Greek, MD    Allergies    Patient has no known allergies.  Review of Systems   Review of Systems  Constitutional: Negative for fever.  HENT: Negative for sore throat.   Respiratory: Negative for shortness of breath.   Cardiovascular: Negative for chest pain.  Gastrointestinal: Positive for abdominal pain and nausea. Negative for constipation, diarrhea, hematemesis and vomiting.  Genitourinary: Negative for flank pain.  Musculoskeletal: Negative for back pain.  Skin: Negative for pallor and wound.  Neurological: Negative for light-headedness and headaches.  All other systems reviewed and are negative.   Physical Exam Updated Vital Signs BP 108/65   Pulse 80   Temp 98.2 F (36.8 C) (Oral)   Resp 17   Ht 5\' 4"  (1.626 m)   Wt (!) 113.9 kg   SpO2 95%   BMI 43.08  kg/m   Physical Exam Vitals and nursing note reviewed.  Constitutional:      Appearance: She is well-developed. She is not ill-appearing or toxic-appearing.  HENT:     Head: Normocephalic and atraumatic.  Cardiovascular:     Rate and Rhythm: Normal rate.     Pulses:          Radial pulses are 2+ on the right side and 2+ on the left side.  Pulmonary:     Effort: Pulmonary effort is normal.     Breath sounds: Normal breath sounds. No wheezing or rhonchi.  Abdominal:  General: Abdomen is flat. Bowel sounds are normal.     Palpations: Abdomen is soft.     Tenderness: There is abdominal tenderness in the right upper quadrant, epigastric area and left upper quadrant. There is rebound. There is no right CVA tenderness or left CVA tenderness. Negative signs include McBurney's sign.     Hernia: No hernia is present.     Comments: Abdomen is soft, and tender to palpation mostly on epigastric region. No guarding, bowel sound present in all four quadrants.   Musculoskeletal:     Right lower leg: No tenderness. No edema.     Left lower leg: No tenderness. No edema.  Skin:    General: Skin is warm and dry.  Neurological:     Mental Status: She is alert and oriented to person, place, and time.     ED Results / Procedures / Treatments   Labs (all labs ordered are listed, but only abnormal results are displayed) Labs Reviewed  BASIC METABOLIC PANEL - Abnormal; Notable for the following components:      Result Value   Glucose, Bld 119 (*)    All other components within normal limits  CBC - Abnormal; Notable for the following components:   RBC 5.44 (*)    MCH 25.4 (*)    All other components within normal limits  HEPATIC FUNCTION PANEL - Abnormal; Notable for the following components:   Total Protein 5.7 (*)    Albumin 3.0 (*)    All other components within normal limits  TROPONIN I (HIGH SENSITIVITY) - Abnormal; Notable for the following components:   Troponin I (High Sensitivity)  27 (*)    All other components within normal limits  TROPONIN I (HIGH SENSITIVITY) - Abnormal; Notable for the following components:   Troponin I (High Sensitivity) 24 (*)    All other components within normal limits  LIPASE, BLOOD  I-STAT BETA HCG BLOOD, ED ([facility], [facility], [facility] ONLY)    EKG EKG Interpretation  Date/Time:  Wednesday July 06 2020 15:15:21 EDT Ventricular Rate:  86 PR Interval:  174 QRS Duration: 70 QT Interval:  374 QTC Calculation: 447 R Axis:   -43 Text Interpretation: Normal sinus rhythm Right atrial enlargement Left axis deviation Pulmonary disease pattern Minimal voltage criteria for LVH, may be normal variant ( R in aVL ) T wave abnormality, consider lateral ischemia Abnormal ECG When compared with ECG of 01/27/2018, No significant change was found Confirmed by Delora Fuel ([id number]) on 07/06/2020 11:49:55 PM   Radiology No results found.  Procedures Procedures (including critical care time)  Medications Ordered in ED Medications  sodium chloride flush (NS) 0.9 % injection 3 mL (3 mLs Intravenous Given 07/07/20 0101)  sodium chloride 0.9 % bolus 500 mL (0 mLs Intravenous Stopped 07/07/20 0230)  morphine 4 MG/ML injection 4 mg (4 mg Intravenous Given 07/07/20 0101)  ondansetron (ZOFRAN) injection 4 mg (4 mg Intravenous Given 07/07/20 0101)  iohexol (OMNIPAQUE) 300 MG/ML solution 100 mL (100 mLs Intravenous Contrast Given 07/07/20 0337)  amoxicillin-clavulanate (AUGMENTIN) 875-125 MG per tablet 1 tablet (1 tablet Oral Given 07/07/20 3291)    ED Course  I have reviewed the triage vital signs and the nursing notes.  Pertinent labs & imaging results that were available during my care of the patient were reviewed by me and considered in my medical decision making (see chart for details).    MDM Rules/Calculators/A&P     Patient with an extensive cardiac history along with obesity presents to the  ED with a chief complaint of epigastric pain x 24 hours. No medication taken  for improvement in symptoms. No prior history of surgical intervention to the abdomen aside from hysterectomy. Endorses nausea and dry heaving but no vomiting, normal bowel movements. No history of alcohol abuse, no prior history of gallstones.   Arrived in the ED with stable vital signs, however had an episode prior to being brought in the room of of "dizziness and feeling like I was going to pass out", vitals were rechecked with soft BP of 98/78, she does endorse anorexia for the past 24 hours. Patient states "I goggle my symptoms and think is my pancreas", No prior gallbladder attacks but does report a history of GERD which does not feel like this episode.  Of note, patient did have a visit with cardiology [facility] two days ago and was told "she had an abnormal EKG" and would need stress test. She reports no chest pain on today's visit, does voice shortness of breath "its because im fat", no changes. During evaluation she is overall non toxic appearing, lungs are diminished to auscultation but without wheezing or rales. Abdomen is soft, ttp along the epigastric region with some rebound noted. No active episode of vomiting, vitals are stable with normotensive pressure.  Bowel sounds are present throughout. Differentials diagnosis included but not limited to ACS versus cholecystitis versus pancreatitis.   Interpretation of her labs by me with a CBC without leukocytosis, hemoglobin is within normal limits. BMP without electrolyte derangement, kidney function is unremarkable. Hepatic function was added. Beta HCG is negative.   2:19 AM Hepatic function is unremarkable, no elevation on LFT'S. Will obtain CT Abdomen to further evaluated. Given morphine and zofran for symptomatic control.   CT Abdomen showed: Mild colitis involving the right colon and cecum. No surrounding  fluid collections or free air.    Small amount of abdominopelvic ascites.   4:19 AM spoke to Dr. Einar Gip of cardiology who  reviewed patient's EKG, and feels that patient is appropriate for outpatient cardiac work-up. Troponin's were 27 to 24.   Case discussed with my attending Dr. Betsey Holiday who has also evaluated patient and agrees with disposition. Patient will be place on Augmentin to help with colitis pain, she is to follow up with PCP after completion. Patient understands and agrees with management. Return precautions discussed at length.    Portions of this note were generated with [employer]. Dictation errors may occur despite best attempts at proofreading.  Final Clinical Impression(s) / ED Diagnoses Final diagnoses:  Epigastric pain  Chest pain, unspecified type  Colitis    Rx / DC Orders ED Discharge Orders         Ordered    amoxicillin-clavulanate (AUGMENTIN) 875-125 MG tablet  2 times daily     Discontinue  Reprint     07/07/20 0612    fluconazole (DIFLUCAN) 150 MG tablet  Weekly     Discontinue  Reprint     07/07/20 0622           Janeece Fitting, PA-C 07/11/20 2211    Janeece Fitting, PA-C 07/11/20 2213    Orpah Greek, MD 07/17/20 [phone]

## 2020-07-13 ENCOUNTER — Other Ambulatory Visit: Payer: Self-pay

## 2020-07-13 ENCOUNTER — Ambulatory Visit: Payer: Medicare Other

## 2020-07-13 DIAGNOSIS — R9431 Abnormal electrocardiogram [ECG] [EKG]: Secondary | ICD-10-CM

## 2020-07-18 ENCOUNTER — Ambulatory Visit: Payer: Medicare Other

## 2020-07-18 ENCOUNTER — Other Ambulatory Visit: Payer: Self-pay

## 2020-07-18 DIAGNOSIS — R9431 Abnormal electrocardiogram [ECG] [EKG]: Secondary | ICD-10-CM | POA: Diagnosis not present

## 2020-07-20 ENCOUNTER — Other Ambulatory Visit: Payer: Self-pay

## 2020-07-20 ENCOUNTER — Ambulatory Visit: Payer: Medicare Other | Admitting: Cardiology

## 2020-07-20 ENCOUNTER — Encounter: Payer: Self-pay | Admitting: Cardiology

## 2020-07-20 VITALS — BP 160/82 | HR 81 | Resp 17 | Ht 64.0 in | Wt 251.6 lb

## 2020-07-20 DIAGNOSIS — I10 Essential (primary) hypertension: Secondary | ICD-10-CM

## 2020-07-20 DIAGNOSIS — Z712 Person consulting for explanation of examination or test findings: Secondary | ICD-10-CM

## 2020-07-20 DIAGNOSIS — R9431 Abnormal electrocardiogram [ECG] [EKG]: Secondary | ICD-10-CM

## 2020-07-20 MED ORDER — AMLODIPINE BESYLATE 5 MG PO TABS
5.0000 mg | ORAL_TABLET | Freq: Every evening | ORAL | 0 refills | Status: DC
Start: 2020-07-20 — End: 2023-01-17

## 2020-07-20 NOTE — Progress Notes (Signed)
Date:  07/20/2020   ID:  Stephanie Best, DOB Jan 03, 1961, MRN [id number]  PCP:  Audley Hose, MD  Cardiologist:  Rex Kras, DO, [facility] (established care 07/05/2020)  Date: 07/20/20 Last Office Visit: 07/05/2020  Chief Complaint  Patient presents with  . Abnormal ECG  . Results    HPI  Stephanie Best is a 59 y.o. female who presents to the office with a chief complaint of "abnormal EKG and  review test results." Patient's past medical history and cardiovascular risk factors include: Hypertension, history of brain aneurysm, family history of cardiovascular disease, vertigo, obesity due to excess calorie.   Very pleasant African-American female who presents to the office for evaluation of an abnormal EKG. patient is requested to undergo an echocardiogram and nuclear stress test.  Results reviewed with with her in great detail at today's office visit and noted below for further reference.    Since last visit patient has not had any chest pain or shortness of breath at rest or with effort related activities.  Patient systolic blood pressure continues to be elevated and she was recommended to see her PCP for medication titration.  She has an upcoming appointment in the following weeks.  She states that she just got a blood pressure cuff in the mail yesterday and will keep a log of her blood pressures for her PCP to review.  Her initial blood pressure was 180 mmHg and on recheck it continue to be 160 mmHg.    Since last office visit patient has been seen at the hospital for noncardiac reasons and was later discharged.  Denies prior history of coronary artery disease, myocardial infarction, congestive heart failure, deep venous thrombosis, pulmonary embolism, stroke, transient ischemic attack.  FUNCTIONAL STATUS: No structured exercise program or daily routine.    ALLERGIES: No Known Allergies  MEDICATION LIST PRIOR TO VISIT: Current Meds  Medication Sig  . albuterol (PROVENTIL  HFA;VENTOLIN HFA) 108 (90 Base) MCG/ACT inhaler Inhale 2 puffs into the lungs every 4 (four) hours as needed for wheezing or shortness of breath.  Marland Kitchen aspirin EC 81 MG tablet Take 81 mg by mouth daily.   . cetirizine (ZYRTEC) 10 MG tablet Take 10 mg by mouth daily as needed for allergies.  . clonazePAM (KLONOPIN) 0.5 MG tablet Take 1 tablet by mouth daily as needed for anxiety.   . fluticasone furoate-vilanterol (BREO ELLIPTA) 100-25 MCG/INH AEPB Inhale 1 puff into the lungs daily. (Patient taking differently: Inhale 1 puff into the lungs daily as needed (Shortness of breath/Wheezing). )  . lisinopril-hydrochlorothiazide (ZESTORETIC) 10-12.5 MG tablet Take 1 tablet by mouth daily.  . meclizine (ANTIVERT) 12.5 MG tablet Take 1 tablet (12.5 mg total) by mouth 3 (three) times daily as needed for dizziness.     PAST MEDICAL HISTORY: Past Medical History:  Diagnosis Date  . Allergy   . 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 Asthma   . Depression    no meds  . Hypertension   . Obesity   . Vertigo 03/2014    PAST SURGICAL HISTORY: Past Surgical History:  Procedure Laterality Date  . ABDOMINAL HYSTERECTOMY    . BREAST REDUCTION SURGERY  1998  . CESAREAN SECTION     x 2  . COLONOSCOPY  07/2012   Henrene Pastor polyps  . IR GENERIC HISTORICAL  02/24/2015   IR ANGIO INTRA EXTRACRAN SEL COM CAROTID INNOMINATE UNI L MOD SED 02/24/2015  Consuella Lose, MD [facility]  . IR GENERIC HISTORICAL  02/24/2015   IR ANGIO INTRA EXTRACRAN SEL INTERNAL CAROTID UNI R MOD SED 02/24/2015 Consuella Lose, MD [facility]  . IR GENERIC HISTORICAL  02/24/2015   IR ANGIO VERTEBRAL SEL VERTEBRAL BILAT MOD SED 02/24/2015 Consuella Lose, MD [facility]  . KNEE ARTHROSCOPY Bilateral 1995,06/2008   x 5  . SHOULDER SURGERY     tumor removal  . WISDOM TOOTH EXTRACTION      FAMILY HISTORY: The patient family history includes Cancer (age of onset: 36) in  her father; Healthy in her daughter; Hypertension in her mother; Other in her son; Stomach cancer (age of onset: 8) in her father.  SOCIAL HISTORY:  The patient  reports that she has never smoked. She has never used smokeless tobacco. She reports current alcohol use of about 1.0 standard drink of alcohol per week. She reports that she does not use drugs.  REVIEW OF SYSTEMS: Review of Systems  Constitutional: Negative for chills and fever.  HENT: Negative for hoarse voice and nosebleeds.   Eyes: Negative for discharge, double vision and pain.  Cardiovascular: Negative for chest pain, claudication, dyspnea on exertion, leg swelling, near-syncope, orthopnea, palpitations, paroxysmal nocturnal dyspnea and syncope.  Respiratory: Negative for hemoptysis and shortness of breath.   Musculoskeletal: Negative for muscle cramps and myalgias.  Gastrointestinal: Negative for abdominal pain, constipation, diarrhea, hematemesis, hematochezia, melena, nausea and vomiting.    PHYSICAL EXAM: Vitals with BMI 07/20/2020 07/07/2020 07/07/2020  Height 5' 4"  - -  Weight 251 lbs 10 oz - -  BMI 16.07 - -  Systolic 371 - 062  Diastolic 82 - 65  Pulse 81 80 -    CONSTITUTIONAL: Well-developed and well-nourished. No acute distress.  SKIN: Skin is warm and dry. No rash noted. No cyanosis. No pallor. No jaundice HEAD: Normocephalic and atraumatic.  EYES: No scleral icterus MOUTH/THROAT: Moist oral membranes.  NECK: No JVD present. No thyromegaly noted. No carotid bruits  LYMPHATIC: No visible cervical adenopathy.  CHEST Normal respiratory effort. No intercostal retractions  LUNGS: Clear to auscultation bilaterally.  No stridor. No wheezes. No rales.  CARDIOVASCULAR: Regular rate and rhythm, positive S1-S2, no murmurs rubs or gallops appreciated. ABDOMINAL: Obese, soft, nontender, nondistended, positive bowel sounds in all 4 quadrants.  No apparent ascites.  EXTREMITIES: No peripheral edema  HEMATOLOGIC: No  significant bruising NEUROLOGIC: Oriented to person, place, and time. Nonfocal. Normal muscle tone.  PSYCHIATRIC: Normal mood and affect. Normal behavior. Cooperative  CARDIAC DATABASE: EKG: 07/05/2020: Normal sinus rhythm, 75 bpm, leftward axis, poor R wave progression, old anterior infarct, ST-T changes in the high lateral and lateral leads suggestive of possible ischemia, without underlying injury pattern.   Echocardiogram: 07/13/2020:  Study Quality: Technically Difficult  Hyperdynamic LV systolic function with visual EF >70%. Left ventricle cavity is normal in size. Moderate left ventricular hypertrophy. Normal global wall motion. Normal diastolic filling pattern, normal LAP.  Calculated EF 55%.  Mild calcification of the mitral valve annulus. Trace mitral regurgitation.  Mild tricuspid regurgitation.  No prior study for comparison.  Stress Testing: Lexiscan/modified Bruce Tetrofosmin stress test 07/18/2020: Lexiscan/modified Bruce nuclear stress test performed using 1-day protocol. Stress EKG is non-diagnostic, as this is pharmacological stress test. In addition, stress EKG showed sinus tachycardia at 80% MPHR, poor R wave progression, possible old anteroseptal infarct, no ischemic changes.  Normal myocardial perfusion. Stress LVEF 66%. Low risk study.  Heart Catheterization: None  LABORATORY DATA: External Labs: Collected: 06/09/2020 Creatinine  0.76 mg/dL. eGFR: 101 mL/min per 1.73 m Lipid profile: Total cholesterol 170, triglycerides 116, HDL 53, LDL 96, non-HDL 117 Hemoglobin A1c: 5.8 TSH: 5.49 and free T4 0.9.   IMPRESSION:    ICD-10-CM   1. Nonspecific abnormal electrocardiogram (ECG) (EKG)  R94.31   2. Benign hypertension  I10   3. Class 3 severe obesity due to excess calories without serious comorbidity with body mass index (BMI) of 40.0 to 44.9 in adult (HCC)  E66.01    Z68.41   4. Encounter to discuss test results  Z71.2      RECOMMENDATIONS: Stephanie Best  is a 59 y.o. female whose past medical history and cardiac risk factors include: Hypertension, history of brain aneurysm, family history of cardiovascular disease, vertigo, obesity due to excess calorie.   Abnormal EKG:  Patient denies symptoms of chest pain or shortness of breath at rest or with effort related activities.    Echocardiogram noted hyperdynamic LVEF with moderate LVH no significant valvular heart disease.  In the setting of uncontrolled hypertension patient is educated on the importance of better blood pressure management.  Patient states that she will follow-up with her PCP regarding this matter.  Since her blood pressure was elevated at today's office visit at around 180 mmHg we did recheck it at the end of the office visit and it was 160 mmHg.  Until she gets the chance to see her primary care physician I will start her on Norvasc 5 mg p.o. daily in the interim.    Nuclear stress test noted normal myocardial perfusion.    Benign essential hypertension: . Office blood pressure is not at goal.  . Medication reconciled.  . Currently managed by primary care team.  Recommended a goal systolic blood pressure between 120-130 mmHg. . Low salt diet recommended. A diet that is rich in fruits, vegetables, legumes, and low-fat dairy products and low in snacks, sweets, and meats (such as the Dietary Approaches to Stop Hypertension [DASH] diet).  . Since her blood pressures range between 160-180 mmHg the shared decision was to start her on low-dose Norvasc until she gets a chance to see her PCP.  We will start Norvasc 5 mg p.o. every afternoon.  Obesity, due to excess calories: Body mass index is 43.19 kg/m. . I reviewed with the patient the importance of diet, regular physical activity/exercise, weight loss.   . Patient is educated on increasing physical activity gradually as tolerated.  With the goal of moderate intensity exercise for 30 minutes a day 5 days a week.  FINAL MEDICATION  LIST END OF ENCOUNTER: Meds ordered this encounter  Medications  . amLODipine (NORVASC) 5 MG tablet    Sig: Take 1 tablet (5 mg total) by mouth every evening.    Dispense:  30 tablet    Refill:  0    Current Outpatient Medications:  .  albuterol (PROVENTIL HFA;VENTOLIN HFA) 108 (90 Base) MCG/ACT inhaler, Inhale 2 puffs into the lungs every 4 (four) hours as needed for wheezing or shortness of breath., Disp: 1 Inhaler, Rfl: 0 .  aspirin EC 81 MG tablet, Take 81 mg by mouth daily. , Disp: , Rfl:  .  cetirizine (ZYRTEC) 10 MG tablet, Take 10 mg by mouth daily as needed for allergies., Disp: , Rfl:  .  clonazePAM (KLONOPIN) 0.5 MG tablet, Take 1 tablet by mouth daily as needed for anxiety. , Disp: , Rfl:  .  fluticasone furoate-vilanterol (BREO ELLIPTA) 100-25 MCG/INH AEPB, Inhale 1 puff into  the lungs daily. (Patient taking differently: Inhale 1 puff into the lungs daily as needed (Shortness of breath/Wheezing). ), Disp: 60 each, Rfl: 11 .  lisinopril-hydrochlorothiazide (ZESTORETIC) 10-12.5 MG tablet, Take 1 tablet by mouth daily., Disp: , Rfl:  .  meclizine (ANTIVERT) 12.5 MG tablet, Take 1 tablet (12.5 mg total) by mouth 3 (three) times daily as needed for dizziness., Disp: 30 tablet, Rfl: 0 .  amLODipine (NORVASC) 5 MG tablet, Take 1 tablet (5 mg total) by mouth every evening., Disp: 30 tablet, Rfl: 0  No orders of the defined types were placed in this encounter.  --Continue cardiac medications as reconciled in final medication list. --Return in about 1 year (around 07/20/2021) for re-evaluation of symptoms. . Or sooner if needed. --Continue follow-up with your primary care physician regarding the management of your other chronic comorbid conditions.  Patient's questions and concerns were addressed to her satisfaction. She voices understanding of the instructions provided during this encounter.   This note was created using a voice recognition software as a result there may be grammatical  errors inadvertently enclosed that do not reflect the nature of this encounter. Every attempt is made to correct such errors.  Rex Kras, [place], [facility]  Pager: [phone] Office: [phone]

## 2020-08-03 ENCOUNTER — Ambulatory Visit: Payer: Medicare Other | Admitting: Cardiology

## 2020-08-08 DIAGNOSIS — D170 Benign lipomatous neoplasm of skin and subcutaneous tissue of head, face and neck: Secondary | ICD-10-CM | POA: Diagnosis not present

## 2020-08-22 ENCOUNTER — Ambulatory Visit (HOSPITAL_BASED_OUTPATIENT_CLINIC_OR_DEPARTMENT_OTHER): Payer: Medicare Other | Admitting: Internal Medicine

## 2020-08-29 ENCOUNTER — Ambulatory Visit (HOSPITAL_BASED_OUTPATIENT_CLINIC_OR_DEPARTMENT_OTHER): Payer: Medicare Other | Attending: Internal Medicine | Admitting: Internal Medicine

## 2020-08-29 ENCOUNTER — Other Ambulatory Visit: Payer: Self-pay

## 2020-08-29 DIAGNOSIS — G4733 Obstructive sleep apnea (adult) (pediatric): Secondary | ICD-10-CM | POA: Diagnosis not present

## 2020-08-30 ENCOUNTER — Ambulatory Visit: Payer: Medicare Other | Admitting: Internal Medicine

## 2020-09-03 DIAGNOSIS — G4733 Obstructive sleep apnea (adult) (pediatric): Secondary | ICD-10-CM | POA: Diagnosis not present

## 2020-09-03 NOTE — Procedures (Signed)
   Patient Name: Stephanie Best, Stephanie Best Date: 08/29/2020 Gender: Female D.O.B: [birth year] Age (years): 54 Referring Provider: Latanya Presser MD Height (inches): 19 Interpreting Physician: Baird Lyons MD, ABSM Weight (lbs): 256 RPSGT: Jacolyn Reedy BMI: 44 MRN: [id number] Neck Size: 15.25  CLINICAL INFORMATION Sleep Study Type: HST Indication for sleep study: OSA Epworth Sleepiness Score: 6  SLEEP STUDY TECHNIQUE A multi-channel overnight portable sleep study was performed. The channels recorded were: nasal airflow, thoracic respiratory movement, and oxygen saturation with a pulse oximetry. Snoring was also monitored.  MEDICATIONS Patient self administered medications include: none reported.  SLEEP ARCHITECTURE Patient was studied for 507.2 minutes. The sleep efficiency was 100.0 % and the patient was supine for 62.4%. The arousal index was 0.0 per hour.  RESPIRATORY PARAMETERS The overall AHI was 21.8 per hour, with a central apnea index of 0.1 per hour. The oxygen nadir was 79% during sleep.  CARDIAC DATA Mean heart rate during sleep was 71.9 bpm.  IMPRESSIONS - Moderate obstructive sleep apnea occurred during this study (AHI = 21.8/h). - No significant central sleep apnea occurred during this study (CAI = 0.1/h). - Oxygen desaturation was noted during this study (Min O2 = 79%). Mean sat 92%. - Patient snored.  DIAGNOSIS - Obstructive Sleep Apnea (G47.33)  RECOMMENDATIONS - Suggest CPAP titration sleep study or autopap. Other options would be based on clinical judgment. - Be careful with alcohol, sedatives and other CNS depressants that may worsen sleep apnea and disrupt normal sleep architecture. - Sleep hygiene should be reviewed to assess factors that may improve sleep quality. - Weight management and regular exercise should be initiated or continued.  [Electronically signed] 09/03/2020 11:07 AM  Baird Lyons MD, ABSM Diplomate, American Board of Sleep  Medicine   NPI: [id number]                          [place] [place] [facility]  ELECTRONICALLY SIGNED ON:  09/03/2020, 11:04 AM [place] PH: [phone]   FX: [phone] [place]

## 2020-10-19 ENCOUNTER — Ambulatory Visit: Payer: Medicare Other | Admitting: Internal Medicine

## 2020-11-08 DIAGNOSIS — R8290 Unspecified abnormal findings in urine: Secondary | ICD-10-CM | POA: Diagnosis not present

## 2020-11-08 DIAGNOSIS — Z1239 Encounter for other screening for malignant neoplasm of breast: Secondary | ICD-10-CM | POA: Diagnosis not present

## 2020-11-08 DIAGNOSIS — I10 Essential (primary) hypertension: Secondary | ICD-10-CM | POA: Diagnosis not present

## 2020-11-08 DIAGNOSIS — Z23 Encounter for immunization: Secondary | ICD-10-CM | POA: Diagnosis not present

## 2020-11-08 DIAGNOSIS — Z0001 Encounter for general adult medical examination with abnormal findings: Secondary | ICD-10-CM | POA: Diagnosis not present

## 2020-11-24 DIAGNOSIS — R7303 Prediabetes: Secondary | ICD-10-CM | POA: Diagnosis not present

## 2020-11-24 DIAGNOSIS — I10 Essential (primary) hypertension: Secondary | ICD-10-CM | POA: Diagnosis not present

## 2020-11-24 DIAGNOSIS — R718 Other abnormality of red blood cells: Secondary | ICD-10-CM | POA: Diagnosis not present

## 2021-01-19 ENCOUNTER — Other Ambulatory Visit: Payer: Self-pay | Admitting: Family Medicine

## 2021-01-19 DIAGNOSIS — R42 Dizziness and giddiness: Secondary | ICD-10-CM

## 2021-01-19 NOTE — Telephone Encounter (Signed)
Called pt to find out if she is under a new PCP care. No answer nad LVM please advise if this needs to be refused.

## 2021-01-27 DIAGNOSIS — I10 Essential (primary) hypertension: Secondary | ICD-10-CM | POA: Diagnosis not present

## 2021-01-27 DIAGNOSIS — M79671 Pain in right foot: Secondary | ICD-10-CM | POA: Diagnosis not present

## 2021-01-27 DIAGNOSIS — G4733 Obstructive sleep apnea (adult) (pediatric): Secondary | ICD-10-CM | POA: Diagnosis not present

## 2021-01-27 DIAGNOSIS — D509 Iron deficiency anemia, unspecified: Secondary | ICD-10-CM | POA: Diagnosis not present

## 2021-01-27 DIAGNOSIS — R7989 Other specified abnormal findings of blood chemistry: Secondary | ICD-10-CM | POA: Diagnosis not present

## 2021-01-27 DIAGNOSIS — R7303 Prediabetes: Secondary | ICD-10-CM | POA: Diagnosis not present

## 2021-01-27 DIAGNOSIS — R42 Dizziness and giddiness: Secondary | ICD-10-CM | POA: Diagnosis not present

## 2021-02-21 DIAGNOSIS — H1012 Acute atopic conjunctivitis, left eye: Secondary | ICD-10-CM | POA: Diagnosis not present

## 2021-02-23 DIAGNOSIS — D170 Benign lipomatous neoplasm of skin and subcutaneous tissue of head, face and neck: Secondary | ICD-10-CM | POA: Diagnosis not present

## 2021-02-28 DIAGNOSIS — G4733 Obstructive sleep apnea (adult) (pediatric): Secondary | ICD-10-CM | POA: Diagnosis not present

## 2021-03-02 DIAGNOSIS — Z4802 Encounter for removal of sutures: Secondary | ICD-10-CM | POA: Diagnosis not present

## 2021-03-09 DIAGNOSIS — D509 Iron deficiency anemia, unspecified: Secondary | ICD-10-CM | POA: Diagnosis not present

## 2021-03-09 DIAGNOSIS — R7303 Prediabetes: Secondary | ICD-10-CM | POA: Diagnosis not present

## 2021-03-09 DIAGNOSIS — M79671 Pain in right foot: Secondary | ICD-10-CM | POA: Diagnosis not present

## 2021-03-09 DIAGNOSIS — I10 Essential (primary) hypertension: Secondary | ICD-10-CM | POA: Diagnosis not present

## 2021-03-14 DIAGNOSIS — I10 Essential (primary) hypertension: Secondary | ICD-10-CM | POA: Diagnosis not present

## 2021-03-14 DIAGNOSIS — D509 Iron deficiency anemia, unspecified: Secondary | ICD-10-CM | POA: Diagnosis not present

## 2021-03-14 DIAGNOSIS — G2581 Restless legs syndrome: Secondary | ICD-10-CM | POA: Diagnosis not present

## 2021-03-14 DIAGNOSIS — G4733 Obstructive sleep apnea (adult) (pediatric): Secondary | ICD-10-CM | POA: Diagnosis not present

## 2021-03-14 DIAGNOSIS — M79671 Pain in right foot: Secondary | ICD-10-CM | POA: Diagnosis not present

## 2021-03-14 DIAGNOSIS — R7303 Prediabetes: Secondary | ICD-10-CM | POA: Diagnosis not present

## 2021-03-30 ENCOUNTER — Ambulatory Visit (INDEPENDENT_AMBULATORY_CARE_PROVIDER_SITE_OTHER): Payer: Medicare Other | Admitting: Podiatry

## 2021-03-30 ENCOUNTER — Other Ambulatory Visit: Payer: Self-pay

## 2021-03-30 ENCOUNTER — Encounter: Payer: Self-pay | Admitting: Podiatry

## 2021-03-30 ENCOUNTER — Ambulatory Visit (INDEPENDENT_AMBULATORY_CARE_PROVIDER_SITE_OTHER): Payer: Medicare Other

## 2021-03-30 DIAGNOSIS — M7671 Peroneal tendinitis, right leg: Secondary | ICD-10-CM

## 2021-03-30 DIAGNOSIS — M7752 Other enthesopathy of left foot: Secondary | ICD-10-CM

## 2021-03-30 DIAGNOSIS — M79672 Pain in left foot: Secondary | ICD-10-CM | POA: Diagnosis not present

## 2021-03-30 DIAGNOSIS — M7751 Other enthesopathy of right foot: Secondary | ICD-10-CM | POA: Diagnosis not present

## 2021-03-30 DIAGNOSIS — M79671 Pain in right foot: Secondary | ICD-10-CM

## 2021-03-30 MED ORDER — TRIAMCINOLONE ACETONIDE 10 MG/ML IJ SUSP
20.0000 mg | Freq: Once | INTRAMUSCULAR | Status: AC
  Administered 2021-03-30: 20 mg

## 2021-03-30 NOTE — Progress Notes (Signed)
Subjective:   Patient ID: Stephanie Best, female   DOB: 60 y.o.   MRN: [id number]   HPI Patient states she has had a lot of pain in both feet for around 5 months and does not remember specific injury.  States that it makes it hard for her to do a lot of walking the right foot is worse than the left and has become gradually more of an issue for her over this time.  She does not smoke likes to be active   Review of Systems  All other systems reviewed and are negative.       Objective:  Physical Exam Vitals and nursing note reviewed.  Constitutional:      Appearance: She is well-developed.  Pulmonary:     Effort: Pulmonary effort is normal.  Musculoskeletal:        General: Normal range of motion.  Skin:    General: Skin is warm.  Neurological:     Mental Status: She is alert.     Neurovascular status intact muscle strength adequate range of motion adequate.  Patient is found to have exquisite discomfort in the left peroneal groove right along with moderate discomfort in the sinus tarsi and on the left I noted severe pain in the sinus tarsi mild pain to the peroneal complex.  Good digital perfusion well oriented x3     Assessment:  Acute peroneal tendinitis right over left along with capsulitis left over right of the sinus tarsi     Plan:  H&P reviewed both conditions and at this point I went ahead and I injected the sinus tarsi's with a small amount of medicine right along with peroneal injection 3 mg Kenalog 5 mg Xylocaine and for the left I focused on the sinus tarsi with a small amount of medicine into the peroneal group.  I went ahead today and I dispensed a fascial brace right with instructions on usage and advised on ice and anti-inflammatories  X-rays were negative for signs that there is a fracture or bony involvement associated with this

## 2021-03-31 DIAGNOSIS — G4733 Obstructive sleep apnea (adult) (pediatric): Secondary | ICD-10-CM | POA: Diagnosis not present

## 2021-04-21 DIAGNOSIS — R051 Acute cough: Secondary | ICD-10-CM | POA: Diagnosis not present

## 2021-04-21 DIAGNOSIS — I10 Essential (primary) hypertension: Secondary | ICD-10-CM | POA: Diagnosis not present

## 2021-04-21 DIAGNOSIS — J302 Other seasonal allergic rhinitis: Secondary | ICD-10-CM | POA: Diagnosis not present

## 2021-04-26 DIAGNOSIS — I10 Essential (primary) hypertension: Secondary | ICD-10-CM | POA: Diagnosis not present

## 2021-04-28 DIAGNOSIS — R1011 Right upper quadrant pain: Secondary | ICD-10-CM | POA: Diagnosis not present

## 2021-04-28 DIAGNOSIS — K121 Other forms of stomatitis: Secondary | ICD-10-CM | POA: Diagnosis not present

## 2021-04-28 DIAGNOSIS — G2581 Restless legs syndrome: Secondary | ICD-10-CM | POA: Diagnosis not present

## 2021-04-30 DIAGNOSIS — G4733 Obstructive sleep apnea (adult) (pediatric): Secondary | ICD-10-CM | POA: Diagnosis not present

## 2021-05-01 ENCOUNTER — Other Ambulatory Visit: Payer: Self-pay | Admitting: Internal Medicine

## 2021-05-01 DIAGNOSIS — R1011 Right upper quadrant pain: Secondary | ICD-10-CM

## 2021-05-19 ENCOUNTER — Ambulatory Visit
Admission: RE | Admit: 2021-05-19 | Discharge: 2021-05-19 | Disposition: A | Discharge status: Home / Self Care | Payer: Medicare Other | Source: Ambulatory Visit | Attending: Internal Medicine | Admitting: Internal Medicine

## 2021-05-19 DIAGNOSIS — K760 Fatty (change of) liver, not elsewhere classified: Secondary | ICD-10-CM | POA: Diagnosis not present

## 2021-05-19 DIAGNOSIS — R1011 Right upper quadrant pain: Secondary | ICD-10-CM

## 2021-05-23 DIAGNOSIS — K121 Other forms of stomatitis: Secondary | ICD-10-CM | POA: Diagnosis not present

## 2021-05-23 DIAGNOSIS — M791 Myalgia, unspecified site: Secondary | ICD-10-CM | POA: Diagnosis not present

## 2021-05-23 DIAGNOSIS — G2581 Restless legs syndrome: Secondary | ICD-10-CM | POA: Diagnosis not present

## 2021-05-23 DIAGNOSIS — M542 Cervicalgia: Secondary | ICD-10-CM | POA: Diagnosis not present

## 2021-05-23 DIAGNOSIS — K760 Fatty (change of) liver, not elsewhere classified: Secondary | ICD-10-CM | POA: Diagnosis not present

## 2021-05-23 DIAGNOSIS — Z23 Encounter for immunization: Secondary | ICD-10-CM | POA: Diagnosis not present

## 2021-05-30 DIAGNOSIS — Z1231 Encounter for screening mammogram for malignant neoplasm of breast: Secondary | ICD-10-CM | POA: Diagnosis not present

## 2021-06-20 DIAGNOSIS — R030 Elevated blood-pressure reading, without diagnosis of hypertension: Secondary | ICD-10-CM | POA: Diagnosis not present

## 2021-07-20 ENCOUNTER — Ambulatory Visit: Payer: Medicare Other | Admitting: Cardiology

## 2021-07-20 DIAGNOSIS — R9431 Abnormal electrocardiogram [ECG] [EKG]: Secondary | ICD-10-CM

## 2021-07-20 DIAGNOSIS — I10 Essential (primary) hypertension: Secondary | ICD-10-CM

## 2021-07-20 DIAGNOSIS — Z712 Person consulting for explanation of examination or test findings: Secondary | ICD-10-CM

## 2021-10-31 DIAGNOSIS — Z0001 Encounter for general adult medical examination with abnormal findings: Secondary | ICD-10-CM | POA: Diagnosis not present

## 2021-10-31 DIAGNOSIS — R7303 Prediabetes: Secondary | ICD-10-CM | POA: Diagnosis not present

## 2021-10-31 DIAGNOSIS — Z136 Encounter for screening for cardiovascular disorders: Secondary | ICD-10-CM | POA: Diagnosis not present

## 2021-11-06 DIAGNOSIS — M79671 Pain in right foot: Secondary | ICD-10-CM | POA: Diagnosis not present

## 2021-11-06 DIAGNOSIS — Z0001 Encounter for general adult medical examination with abnormal findings: Secondary | ICD-10-CM | POA: Diagnosis not present

## 2021-11-06 DIAGNOSIS — R42 Dizziness and giddiness: Secondary | ICD-10-CM | POA: Diagnosis not present

## 2021-11-06 DIAGNOSIS — I10 Essential (primary) hypertension: Secondary | ICD-10-CM | POA: Diagnosis not present

## 2021-11-06 DIAGNOSIS — J45909 Unspecified asthma, uncomplicated: Secondary | ICD-10-CM | POA: Diagnosis not present

## 2021-11-06 DIAGNOSIS — Z1211 Encounter for screening for malignant neoplasm of colon: Secondary | ICD-10-CM | POA: Diagnosis not present

## 2021-11-06 DIAGNOSIS — M25531 Pain in right wrist: Secondary | ICD-10-CM | POA: Diagnosis not present

## 2021-11-06 DIAGNOSIS — Z23 Encounter for immunization: Secondary | ICD-10-CM | POA: Diagnosis not present

## 2021-11-06 DIAGNOSIS — G4733 Obstructive sleep apnea (adult) (pediatric): Secondary | ICD-10-CM | POA: Diagnosis not present

## 2021-11-06 DIAGNOSIS — Z1239 Encounter for other screening for malignant neoplasm of breast: Secondary | ICD-10-CM | POA: Diagnosis not present

## 2021-12-07 DIAGNOSIS — R195 Other fecal abnormalities: Secondary | ICD-10-CM | POA: Diagnosis not present

## 2022-01-03 DIAGNOSIS — G4733 Obstructive sleep apnea (adult) (pediatric): Secondary | ICD-10-CM | POA: Diagnosis not present

## 2022-01-03 DIAGNOSIS — R195 Other fecal abnormalities: Secondary | ICD-10-CM | POA: Diagnosis not present

## 2022-01-29 DIAGNOSIS — J4520 Mild intermittent asthma, uncomplicated: Secondary | ICD-10-CM | POA: Diagnosis not present

## 2022-01-29 DIAGNOSIS — I10 Essential (primary) hypertension: Secondary | ICD-10-CM | POA: Diagnosis not present

## 2022-01-29 DIAGNOSIS — R059 Cough, unspecified: Secondary | ICD-10-CM | POA: Diagnosis not present

## 2022-01-29 DIAGNOSIS — J040 Acute laryngitis: Secondary | ICD-10-CM | POA: Diagnosis not present

## 2022-01-29 DIAGNOSIS — J019 Acute sinusitis, unspecified: Secondary | ICD-10-CM | POA: Diagnosis not present

## 2022-02-14 ENCOUNTER — Encounter: Payer: Self-pay | Admitting: Internal Medicine

## 2022-05-02 DIAGNOSIS — M545 Low back pain, unspecified: Secondary | ICD-10-CM | POA: Diagnosis not present

## 2022-05-02 DIAGNOSIS — I10 Essential (primary) hypertension: Secondary | ICD-10-CM | POA: Diagnosis not present

## 2022-05-28 DIAGNOSIS — M25519 Pain in unspecified shoulder: Secondary | ICD-10-CM | POA: Diagnosis not present

## 2022-05-28 DIAGNOSIS — J4520 Mild intermittent asthma, uncomplicated: Secondary | ICD-10-CM | POA: Diagnosis not present

## 2022-05-28 DIAGNOSIS — M25511 Pain in right shoulder: Secondary | ICD-10-CM | POA: Diagnosis not present

## 2022-05-28 DIAGNOSIS — G2581 Restless legs syndrome: Secondary | ICD-10-CM | POA: Diagnosis not present

## 2022-05-31 DIAGNOSIS — M7541 Impingement syndrome of right shoulder: Secondary | ICD-10-CM | POA: Diagnosis not present

## 2022-05-31 DIAGNOSIS — M25511 Pain in right shoulder: Secondary | ICD-10-CM | POA: Diagnosis not present

## 2022-05-31 DIAGNOSIS — S42001A Fracture of unspecified part of right clavicle, initial encounter for closed fracture: Secondary | ICD-10-CM | POA: Diagnosis not present

## 2022-07-04 DIAGNOSIS — M7541 Impingement syndrome of right shoulder: Secondary | ICD-10-CM | POA: Diagnosis not present

## 2022-07-06 DIAGNOSIS — M25511 Pain in right shoulder: Secondary | ICD-10-CM | POA: Diagnosis not present

## 2022-08-08 DIAGNOSIS — Z1231 Encounter for screening mammogram for malignant neoplasm of breast: Secondary | ICD-10-CM | POA: Diagnosis not present

## 2022-08-22 DIAGNOSIS — I10 Essential (primary) hypertension: Secondary | ICD-10-CM | POA: Diagnosis not present

## 2022-08-22 DIAGNOSIS — G2581 Restless legs syndrome: Secondary | ICD-10-CM | POA: Diagnosis not present

## 2022-09-21 DIAGNOSIS — D509 Iron deficiency anemia, unspecified: Secondary | ICD-10-CM | POA: Diagnosis not present

## 2022-09-21 DIAGNOSIS — Z0001 Encounter for general adult medical examination with abnormal findings: Secondary | ICD-10-CM | POA: Diagnosis not present

## 2022-09-21 DIAGNOSIS — R7303 Prediabetes: Secondary | ICD-10-CM | POA: Diagnosis not present

## 2022-09-21 DIAGNOSIS — Z1329 Encounter for screening for other suspected endocrine disorder: Secondary | ICD-10-CM | POA: Diagnosis not present

## 2022-09-21 DIAGNOSIS — Z136 Encounter for screening for cardiovascular disorders: Secondary | ICD-10-CM | POA: Diagnosis not present

## 2022-09-25 DIAGNOSIS — R42 Dizziness and giddiness: Secondary | ICD-10-CM | POA: Diagnosis not present

## 2022-09-25 DIAGNOSIS — G4733 Obstructive sleep apnea (adult) (pediatric): Secondary | ICD-10-CM | POA: Diagnosis not present

## 2022-09-25 DIAGNOSIS — R7303 Prediabetes: Secondary | ICD-10-CM | POA: Diagnosis not present

## 2022-09-25 DIAGNOSIS — Z1239 Encounter for other screening for malignant neoplasm of breast: Secondary | ICD-10-CM | POA: Diagnosis not present

## 2022-09-25 DIAGNOSIS — D509 Iron deficiency anemia, unspecified: Secondary | ICD-10-CM | POA: Diagnosis not present

## 2022-09-25 DIAGNOSIS — I10 Essential (primary) hypertension: Secondary | ICD-10-CM | POA: Diagnosis not present

## 2022-09-25 DIAGNOSIS — Z0001 Encounter for general adult medical examination with abnormal findings: Secondary | ICD-10-CM | POA: Diagnosis not present

## 2022-09-25 DIAGNOSIS — Z23 Encounter for immunization: Secondary | ICD-10-CM | POA: Diagnosis not present

## 2022-09-25 DIAGNOSIS — J45909 Unspecified asthma, uncomplicated: Secondary | ICD-10-CM | POA: Diagnosis not present

## 2022-10-22 DIAGNOSIS — L723 Sebaceous cyst: Secondary | ICD-10-CM | POA: Diagnosis not present

## 2022-12-24 DIAGNOSIS — J45909 Unspecified asthma, uncomplicated: Secondary | ICD-10-CM | POA: Diagnosis not present

## 2022-12-24 DIAGNOSIS — G4733 Obstructive sleep apnea (adult) (pediatric): Secondary | ICD-10-CM | POA: Diagnosis not present

## 2022-12-24 DIAGNOSIS — D509 Iron deficiency anemia, unspecified: Secondary | ICD-10-CM | POA: Diagnosis not present

## 2022-12-24 DIAGNOSIS — R7303 Prediabetes: Secondary | ICD-10-CM | POA: Diagnosis not present

## 2022-12-24 DIAGNOSIS — I10 Essential (primary) hypertension: Secondary | ICD-10-CM | POA: Diagnosis not present

## 2022-12-24 DIAGNOSIS — G2581 Restless legs syndrome: Secondary | ICD-10-CM | POA: Diagnosis not present

## 2022-12-24 DIAGNOSIS — K760 Fatty (change of) liver, not elsewhere classified: Secondary | ICD-10-CM | POA: Diagnosis not present

## 2022-12-24 DIAGNOSIS — R9431 Abnormal electrocardiogram [ECG] [EKG]: Secondary | ICD-10-CM | POA: Diagnosis not present

## 2022-12-24 DIAGNOSIS — R7989 Other specified abnormal findings of blood chemistry: Secondary | ICD-10-CM | POA: Diagnosis not present

## 2023-01-17 ENCOUNTER — Emergency Department (HOSPITAL_COMMUNITY)
Admission: EM | Admit: 2023-01-17 | Discharge: 2023-01-17 | Disposition: A | Discharge status: Home / Self Care | Payer: 59 | Attending: Emergency Medicine | Admitting: Emergency Medicine

## 2023-01-17 ENCOUNTER — Other Ambulatory Visit: Payer: Self-pay

## 2023-01-17 ENCOUNTER — Encounter (HOSPITAL_COMMUNITY): Payer: Self-pay

## 2023-01-17 ENCOUNTER — Ambulatory Visit (HOSPITAL_COMMUNITY)
Admission: EM | Admit: 2023-01-17 | Discharge: 2023-01-17 | Disposition: A | Discharge status: Other Acute Inpatient Hospital | Payer: 59 | Attending: Internal Medicine | Admitting: Internal Medicine

## 2023-01-17 ENCOUNTER — Emergency Department (HOSPITAL_COMMUNITY): Payer: 59

## 2023-01-17 DIAGNOSIS — Z7982 Long term (current) use of aspirin: Secondary | ICD-10-CM | POA: Diagnosis not present

## 2023-01-17 DIAGNOSIS — J45909 Unspecified asthma, uncomplicated: Secondary | ICD-10-CM | POA: Insufficient documentation

## 2023-01-17 DIAGNOSIS — Z79899 Other long term (current) drug therapy: Secondary | ICD-10-CM | POA: Diagnosis not present

## 2023-01-17 DIAGNOSIS — R55 Syncope and collapse: Secondary | ICD-10-CM | POA: Insufficient documentation

## 2023-01-17 DIAGNOSIS — I10 Essential (primary) hypertension: Secondary | ICD-10-CM | POA: Insufficient documentation

## 2023-01-17 DIAGNOSIS — R42 Dizziness and giddiness: Secondary | ICD-10-CM | POA: Diagnosis not present

## 2023-01-17 DIAGNOSIS — Z7951 Long term (current) use of inhaled steroids: Secondary | ICD-10-CM | POA: Diagnosis not present

## 2023-01-17 LAB — CBC
HCT: 36.8 % (ref 36.0–46.0)
Hemoglobin: 11.5 g/dL — ABNORMAL LOW (ref 12.0–15.0)
MCH: 24.8 pg — ABNORMAL LOW (ref 26.0–34.0)
MCHC: 31.3 g/dL (ref 30.0–36.0)
MCV: 79.5 fL — ABNORMAL LOW (ref 80.0–100.0)
Platelets: 380 10*3/uL (ref 150–400)
RBC: 4.63 MIL/uL (ref 3.87–5.11)
RDW: 14.2 % (ref 11.5–15.5)
WBC: 10.1 10*3/uL (ref 4.0–10.5)
nRBC: 0 % (ref 0.0–0.2)

## 2023-01-17 LAB — TROPONIN I (HIGH SENSITIVITY): Troponin I (High Sensitivity): 4 ng/L (ref <18)

## 2023-01-17 LAB — COMPREHENSIVE METABOLIC PANEL
ALT: 13 U/L (ref 0–44)
AST: 20 U/L (ref 15–41)
Albumin: 3.2 g/dL — ABNORMAL LOW (ref 3.5–5.0)
Alkaline Phosphatase: 48 U/L (ref 38–126)
Anion gap: 9 (ref 5–15)
BUN: 20 mg/dL (ref 6–20)
CO2: 24 mmol/L (ref 22–32)
Calcium: 8.5 mg/dL — ABNORMAL LOW (ref 8.9–10.3)
Chloride: 103 mmol/L (ref 98–111)
Creatinine, Ser: 1.07 mg/dL — ABNORMAL HIGH (ref 0.44–1.00)
GFR, Estimated: 59 mL/min — ABNORMAL LOW (ref >60)
Glucose, Bld: 139 mg/dL — ABNORMAL HIGH (ref 70–99)
Potassium: 3.7 mmol/L (ref 3.5–5.1)
Sodium: 136 mmol/L (ref 135–145)
Total Bilirubin: 0.6 mg/dL (ref 0.3–1.2)
Total Protein: 6.1 g/dL — ABNORMAL LOW (ref 6.5–8.1)

## 2023-01-17 LAB — CBG MONITORING, ED: Glucose-Capillary: 145 mg/dL — ABNORMAL HIGH (ref 70–99)

## 2023-01-17 MED ORDER — SODIUM CHLORIDE 0.9 % IV BOLUS
1000.0000 mL | Freq: Once | INTRAVENOUS | Status: AC
  Administered 2023-01-17: 1000 mL via INTRAVENOUS

## 2023-01-17 MED ORDER — GLUCOSE 40 % PO GEL
ORAL | Status: AC
  Filled 2023-01-17: qty 1.21

## 2023-01-17 MED ORDER — ONDANSETRON HCL 4 MG/2ML IJ SOLN: 4.0000 mg | Freq: Once | INTRAMUSCULAR | Status: DC

## 2023-01-17 MED ORDER — SODIUM CHLORIDE 0.9 % IV BOLUS
500.0000 mL | Freq: Once | INTRAVENOUS | Status: AC
  Administered 2023-01-17: 500 mL via INTRAVENOUS

## 2023-01-17 NOTE — ED Notes (Signed)
Charge Nurse at [facility] ed called and notified that Patient is being transported to them via [facility]. They verbalized understanding.

## 2023-01-17 NOTE — ED Triage Notes (Signed)
Informed by front desk staff that Patient passed out in the waiting room. Patient was complaining of dizziness. According to Patient friend she ate an orange today and took her blood pressure medication. Patient has been nauseous but no vomiting or diarrhea.   According to Patient she has not taken meclizine recently for vertigo.   Patient in wheelchair in the lobby and loss consciousness, unable to answer her name or where she was, Patient slumped back in the wheelchair but re opened eyes within 30 seconds.,

## 2023-01-17 NOTE — ED Notes (Signed)
Got patient undressed into a gown on the monitor did EKG shown to Dr Ree Edman patient is resting with call bell in reach

## 2023-01-17 NOTE — ED Provider Notes (Signed)
[facility] Provider Note   CSN: [id number] Arrival date & time: 01/17/23  4193     History  Chief Complaint  Patient presents with   Near Syncope    Stephanie Best is a 62 y.o. female.   Near Syncope  Patient presents after syncopal episode.  States she felt lightheaded.  States always felt like an out of body experience.  Episode last night.  Felt bad and then today had another episode at the urgent care.  States she went to bed last night and then felt a little off this morning.  No headache.  No confusion.  Reportedly was at urgent care and passed out in the office.  No loss of bladder or bowel control.  No nausea vomiting diarrhea.  Still feels little off.  Is had vertigo in the past but states this does not feel like that.  No blood in the stool or black stools.  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 Authorizing Provider  albuterol (PROVENTIL HFA;VENTOLIN HFA) 108 (90 Base) MCG/ACT inhaler Inhale 2 puffs into the lungs every 4 (four) hours as needed for wheezing or shortness of breath. 02/06/18   Tysinger, Camelia Eng, PA-C  aspirin EC 81 MG tablet Take 81 mg by mouth daily.     [provider]  cetirizine (ZYRTEC) 10 MG tablet Take 10 mg by mouth daily as needed for allergies.    [provider]  clonazePAM (KLONOPIN) 0.5 MG tablet Take 1 tablet by mouth daily as needed for anxiety.     [provider]  fluticasone furoate-vilanterol (BREO ELLIPTA) 100-25 MCG/INH AEPB Inhale 1 puff into the lungs daily. Patient taking differently: Inhale 1 puff into  the lungs daily as needed (Shortness of breath/Wheezing).  01/19/19   Denita Lung, MD  lisinopril-hydrochlorothiazide (ZESTORETIC) 10-12.5 MG tablet Take 1 tablet by mouth daily.    [provider]  meclizine (ANTIVERT) 12.5 MG tablet TAKE 1 TABLET BY MOUTH THREE TIMES DAILY AS NEEDED FOR DIZZINESS 01/20/21   Denita Lung, MD      Allergies    Patient has no known allergies.    Review of Systems   Review of Systems  Cardiovascular:  Positive for near-syncope.    Physical Exam Updated Vital Signs BP (!) 118/57 (BP Location: Left Arm)   Pulse 81   Temp 97.9 F (36.6 C) (Oral)   Resp 16   Ht '5\' 4"'$  (1.626 m)   Wt 114.1 kg   SpO2 100%   BMI 43.18 kg/m  Physical Exam Vitals and nursing note reviewed.  Cardiovascular:     Rate and Rhythm: Regular rhythm.  Pulmonary:     Breath sounds: No wheezing.  Abdominal:     Tenderness: There is no abdominal tenderness.  Musculoskeletal:        General: No tenderness.     Cervical back: Neck supple.  Skin:    General: Skin is warm.     Coloration: Skin is not jaundiced or  pale.  Neurological:     Mental Status: She is alert and oriented to person, place, and time.     ED Results / Procedures / Treatments   Labs (all labs ordered are listed, but only abnormal results are displayed) Labs Reviewed  COMPREHENSIVE METABOLIC PANEL - Abnormal; Notable for the following components:      Result Value   Glucose, Bld 139 (*)    Creatinine, Ser 1.07 (*)    Calcium 8.5 (*)    Total Protein 6.1 (*)    Albumin 3.2 (*)    GFR, Estimated 59 (*)    All other components within normal limits  CBC - Abnormal; Notable for the following components:   Hemoglobin 11.5 (*)    MCV 79.5 (*)    MCH 24.8 (*)    All other components within normal limits  TROPONIN I (HIGH SENSITIVITY)    EKG None  Radiology DG Chest 2 View  Result Date: 01/17/2023 CLINICAL DATA:  62 year old female with history of dizziness and syncope. EXAM: CHEST -  2 VIEW COMPARISON:  Chest x-ray 07/06/2020. FINDINGS: Lung volumes are low. No consolidative airspace disease. No pleural effusions. No pneumothorax. No pulmonary nodule or mass noted. Pulmonary vasculature and the cardiomediastinal silhouette are within normal limits. IMPRESSION: 1. Low lung volumes without radiographic evidence of acute cardiopulmonary disease. Electronically Signed   By: Vinnie Langton M.D.   On: 01/17/2023 09:49    Procedures Procedures    Medications Ordered in ED Medications  sodium chloride 0.9 % bolus 500 mL (0 mLs Intravenous Stopped 01/17/23 1020)    ED Course/ Medical Decision Making/ A&P                             Medical Decision Making Amount and/or Complexity of Data Reviewed Labs: ordered. Radiology: ordered.   Patient with syncopal episode.  Near syncopal last night with syncopal today.  Differential diagnosis includes anemia dehydration arrhythmia.  States she did not feel her heart going fast or slow.  Will get basic blood work and EKG.  Will give small fluid bolus for mild hypotension.  Bp improved. Doesn't feel orthostatic.  Creatinine just mildly elevated.  Patient has lost 40 pounds recently.  Potentially could be somewhat malnourished due to that but also may have an improvement in her blood pressure because of it for now we will cut back on the amlodipine.  Follow-up with PCP.  Will discharge home.        Final Clinical Impression(s) / ED Diagnoses Final diagnoses:  Syncope, unspecified syncope type    Rx / DC Orders ED Discharge Orders     None         Davonna Belling, MD 01/17/23 1043

## 2023-01-17 NOTE — ED Notes (Signed)
AVS reviewed with pt pt stable and ambulatory upon DC. Pt DC with family member.

## 2023-01-17 NOTE — ED Notes (Signed)
Patient is being discharged from the Urgent Care and sent to the Emergency Department via [facility] . Per Gwenette Greet Np, patient is in need of higher level of care due to LOC. Patient is aware and verbalizes understanding of plan of care.  Vitals:   01/17/23 0839  BP: 97/84  Pulse: 93  Resp: (!) 22  SpO2: 95%

## 2023-01-17 NOTE — ED Notes (Signed)
[facility] called for transportation 

## 2023-01-17 NOTE — Discharge Instructions (Addendum)
Follow-up with your doctor.  For now we will stop the amlodipine and follow your blood pressure at home.

## 2023-01-17 NOTE — ED Triage Notes (Signed)
Pt to ED via EMS from urgent care with c/o near syncope episode. Pt states she had a syncope episode last night denies falling or any injuries. Pt stated that's why she was at urgent care today because she had a near syncope episode this morning. [facility] reports that urgent care reports pt BP 741U systolic, Bgl 384 and they gave a 517m bolus of NS. Pt is Aox4 in triage, denies CP or SOB. Reports hx of htn.

## 2023-01-17 NOTE — ED Notes (Signed)
Orthostatic vitals completed pt denies any dizziness or cp.

## 2023-01-30 NOTE — ED Provider Notes (Signed)
Patient presents to urgent care with her family member who contributes to the history for evaluation of near syncope last night and feeling "faint". She is not a diabetic, however she has recently lost a considerable amount of weight with trying in attempt to improve her health. While at urgent care in the lobby, patient experienced a witnessed syncopal episode. Elva did not lose pulse, however she did become unconscious for approximately 2-3 minutes and experienced some confusion when coming out of syncopal episode. No apnea. No seizure-like activity, aura, or postictal state witnessed. After regaining consciousness, Bryanna was alert and oriented x4, however unable to recall event. Denies preceding and current chest pain, shortness of breath, and fever/chills. She did report slight nausea upon regaining consciousness after syncopal episode, but did not have any episodes of emesis. No recent medication changes. She has high blood pressure, states she took her blood pressure medication as prescribed this morning. Denies history of syncope, heart problems, and CVA. No extremity weakness, back/neck pain, or numbness/tingling to extremities.   Vital signs are stable, however BP soft in clinic. Lungs clear, heart sounds normal. EKG shows NSR without ST/T wave changes. IV placed by provider, normal saline bolus started as patient likely suffered vasovagal syncope due to dehydration. Recommend further workup in the ED for urgent blood work to rule out acute cardiac cause of patient's syncopal episode as well as preceding dizziness. She and her family member are agreeable with this plan. She is neurovascularly intact and without focal neurologic deficit after syncopal episode. Recommend transport to ED via [facility] [place] transport, patient is agreeable. Discharged from urgent care with [facility] in stable condition.   Talbot Grumbling, [place] 01/30/23 2206

## 2023-02-01 DIAGNOSIS — D509 Iron deficiency anemia, unspecified: Secondary | ICD-10-CM | POA: Diagnosis not present

## 2023-02-01 DIAGNOSIS — K760 Fatty (change of) liver, not elsewhere classified: Secondary | ICD-10-CM | POA: Diagnosis not present

## 2023-02-01 DIAGNOSIS — R7303 Prediabetes: Secondary | ICD-10-CM | POA: Diagnosis not present

## 2023-02-05 DIAGNOSIS — B3731 Acute candidiasis of vulva and vagina: Secondary | ICD-10-CM | POA: Diagnosis not present

## 2023-02-05 DIAGNOSIS — G4733 Obstructive sleep apnea (adult) (pediatric): Secondary | ICD-10-CM | POA: Diagnosis not present

## 2023-02-05 DIAGNOSIS — R7303 Prediabetes: Secondary | ICD-10-CM | POA: Diagnosis not present

## 2023-02-05 DIAGNOSIS — I10 Essential (primary) hypertension: Secondary | ICD-10-CM | POA: Diagnosis not present

## 2023-02-05 DIAGNOSIS — Z1211 Encounter for screening for malignant neoplasm of colon: Secondary | ICD-10-CM | POA: Diagnosis not present

## 2023-02-05 DIAGNOSIS — N3001 Acute cystitis with hematuria: Secondary | ICD-10-CM | POA: Diagnosis not present

## 2023-02-05 DIAGNOSIS — D509 Iron deficiency anemia, unspecified: Secondary | ICD-10-CM | POA: Diagnosis not present

## 2023-02-18 ENCOUNTER — Encounter: Payer: Self-pay | Admitting: Internal Medicine

## 2023-03-12 DIAGNOSIS — I10 Essential (primary) hypertension: Secondary | ICD-10-CM | POA: Diagnosis not present

## 2023-03-13 DIAGNOSIS — I671 Cerebral aneurysm, nonruptured: Secondary | ICD-10-CM | POA: Diagnosis not present

## 2023-03-25 ENCOUNTER — Ambulatory Visit (AMBULATORY_SURGERY_CENTER): Payer: 59 | Admitting: *Deleted

## 2023-03-25 ENCOUNTER — Encounter: Payer: Self-pay | Admitting: Internal Medicine

## 2023-03-25 VITALS — Ht 64.0 in | Wt 218.0 lb

## 2023-03-25 DIAGNOSIS — Z86010 Personal history of colonic polyps: Secondary | ICD-10-CM

## 2023-03-25 MED ORDER — NA SULFATE-K SULFATE-MG SULF 17.5-3.13-1.6 GM/177ML PO SOLN: 1.0000 | Freq: Once | ORAL | 0 refills | Status: AC

## 2023-03-25 NOTE — Progress Notes (Signed)
No egg or soy allergy known to patient  No issues known to pt with past sedation with any surgeries or procedures Patient denies ever being told they had issues or difficulty with intubation  No FH of Malignant Hyperthermia Pt is  on diet pills Pt is not on  home 02  Pt is not on blood thinners  Pt denies issues with constipation  No A fib or A flutter Have any cardiac testing pending--no Pt instructed to use [employer] or GoodRx for a price reduction on prep    Patient's chart reviewed by Cathlyn Parsons CNRA prior to previsit and patient appropriate for the [facility].  Previsit completed and red dot placed by patient's name on their procedure day (on provider's schedule).

## 2023-04-03 DIAGNOSIS — I671 Cerebral aneurysm, nonruptured: Secondary | ICD-10-CM | POA: Diagnosis not present

## 2023-04-03 DIAGNOSIS — I602 Nontraumatic subarachnoid hemorrhage from anterior communicating artery: Secondary | ICD-10-CM | POA: Diagnosis not present

## 2023-04-23 DIAGNOSIS — H00015 Hordeolum externum left lower eyelid: Secondary | ICD-10-CM | POA: Diagnosis not present

## 2023-04-23 DIAGNOSIS — I10 Essential (primary) hypertension: Secondary | ICD-10-CM | POA: Diagnosis not present

## 2023-04-24 ENCOUNTER — Ambulatory Visit (AMBULATORY_SURGERY_CENTER): Payer: 59 | Admitting: Internal Medicine

## 2023-04-24 ENCOUNTER — Encounter: Payer: Self-pay | Admitting: Internal Medicine

## 2023-04-24 VITALS — BP 133/71 | HR 77 | Temp 97.7°F | Resp 15 | Ht 65.0 in | Wt 218.0 lb

## 2023-04-24 DIAGNOSIS — Z86010 Personal history of colonic polyps: Secondary | ICD-10-CM

## 2023-04-24 DIAGNOSIS — J45909 Unspecified asthma, uncomplicated: Secondary | ICD-10-CM | POA: Diagnosis not present

## 2023-04-24 DIAGNOSIS — Z09 Encounter for follow-up examination after completed treatment for conditions other than malignant neoplasm: Secondary | ICD-10-CM

## 2023-04-24 MED ORDER — SODIUM CHLORIDE 0.9 % IV SOLN: 500.0000 mL | Freq: Once | INTRAVENOUS | Status: DC

## 2023-04-24 NOTE — Op Note (Signed)
[facility] Patient Name: Stephanie Best Procedure Date: 04/24/2023 9:18 AM MRN: [id number] Endoscopist: Wilhemina Bonito. Marina Goodell , MD, [id number] Age: 62 Referring MD:  Date of Birth: 27-Oct-1961 Gender: Female Account #: [id number] Procedure:                Colonoscopy Indications:              High risk colon cancer surveillance: Personal                            history of multiple (3 or more) adenomas, High risk                            colon cancer surveillance: Personal history of                            sessile serrated colon polyp (less than 10 mm in                            size) with no dysplasia. Previous examinations                            2013, 2020 Medicines:                Monitored Anesthesia Care Procedure:                Pre-Anesthesia Assessment:                           - Prior to the procedure, a History and Physical                            was performed, and patient medications and                            allergies were reviewed. The patient's tolerance of                            previous anesthesia was also reviewed. The risks                            and benefits of the procedure and the sedation                            options and risks were discussed with the patient.                            All questions were answered, and informed consent                            was obtained. Prior Anticoagulants: The patient has                            taken no anticoagulant or antiplatelet agents. ASA  Grade Assessment: II - A patient with mild systemic                            disease. After reviewing the risks and benefits,                            the patient was deemed in satisfactory condition to                            undergo the procedure.                           After obtaining informed consent, the colonoscope                            was passed under direct vision. Throughout the                             procedure, the patient's blood pressure, pulse, and                            oxygen saturations were monitored continuously. The                            CF [id number] #[id number] was introduced through the anus                            and advanced to the the cecum, identified by                            appendiceal orifice and ileocecal valve. The                            ileocecal valve, appendiceal orifice, and rectum                            were photographed. The quality of the bowel                            preparation was excellent. The colonoscopy was                            performed without difficulty. The patient tolerated                            the procedure well. The bowel preparation used was                            SUPREP via split dose instruction. Scope In: 9:33:28 AM Scope Out: 9:45:04 AM Scope Withdrawal Time: 0 hours 8 minutes 34 seconds  Total Procedure Duration: 0 hours 11 minutes 36 seconds  Findings:                 Multiple diverticula were found in the left colon  and right colon.                           Internal hemorrhoids were found during retroflexion.                           The exam was otherwise without abnormality on                            direct and retroflexion views. Complications:            No immediate complications. Estimated blood loss:                            None. Estimated Blood Loss:     Estimated blood loss: none. Impression:               - Diverticulosis in the left colon and in the right                            colon.                           - Internal hemorrhoids.                           - The examination was otherwise normal on direct                            and retroflexion views.                           - No specimens collected. Recommendation:           - Repeat colonoscopy in 5 years for surveillance.                           - Patient has a contact  number available for                            emergencies. The signs and symptoms of potential                            delayed complications were discussed with the                            patient. Return to normal activities tomorrow.                            Written discharge instructions were provided to the                            patient.                           - Resume previous diet.                           -  Continue present medications. Wilhemina Bonito. Marina Goodell, MD 04/24/2023 9:49:07 AM This report has been signed electronically.

## 2023-04-24 NOTE — Progress Notes (Signed)
HISTORY OF PRESENT ILLNESS:  Stephanie Best is a 62 y.o. female with a history of multiple adenomatous colon polyps and sessile serrated colon polyp.  Now for surveillance colonoscopy.  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   see  report in chart - no current problems   Asthma    Depression    no meds   Hypertension    Obesity    Sleep apnea    wear c pap   Vertigo 03/2014    Past Surgical History:  Procedure Laterality Date   ABDOMINAL HYSTERECTOMY     BREAST REDUCTION SURGERY  1998   CESAREAN SECTION     x 2   COLONOSCOPY  07/2012   Montell Leopard polyps   IR GENERIC HISTORICAL  02/24/2015   IR ANGIO INTRA EXTRACRAN SEL COM CAROTID INNOMINATE UNI L MOD SED 02/24/2015 Lisbeth Renshaw, MD [facility]   IR GENERIC HISTORICAL  02/24/2015   IR ANGIO INTRA EXTRACRAN SEL INTERNAL CAROTID UNI R MOD SED 02/24/2015 Lisbeth Renshaw, MD [facility]   IR GENERIC HISTORICAL  02/24/2015   IR ANGIO VERTEBRAL SEL VERTEBRAL BILAT MOD SED 02/24/2015 Lisbeth Renshaw, MD [facility]   KNEE ARTHROSCOPY Bilateral 1995,06/2008   x 5   SHOULDER SURGERY     tumor removal   WISDOM TOOTH EXTRACTION      Social History Stephanie Best  reports that she has never smoked. She has never used smokeless tobacco. She reports that she does not currently use alcohol after a past usage of about 1.0 standard drink of alcohol per week. She reports that she does not use drugs.  family history includes Cancer (age of onset: 23) in her father; Healthy in her daughter; Hypertension in her mother; Other in her son; Stomach cancer (age of onset: 59) in her father.  No Known Allergies     PHYSICAL EXAMINATION: Vital signs: BP 122/68   Pulse 86   Temp 97.7 F (36.5 C) (Skin)   Ht 5\' 5"  (1.651 m)   Wt 218 lb (98.9 kg)   SpO2 98%   BMI 36.28 kg/m  General:  Well-developed, well-nourished, no acute distress HEENT: Sclerae are anicteric, conjunctiva pink. Oral mucosa intact Lungs: Clear Heart: Regular Abdomen: soft, nontender, nondistended, no obvious ascites, no peritoneal signs, normal bowel sounds. No organomegaly. Extremities: No edema Psychiatric: alert and oriented x3. Cooperative     ASSESSMENT:  History of multiple adenomatous and sessile serrated colon polyps.   PLAN:   Surveillance colonoscopy

## 2023-04-24 NOTE — Progress Notes (Signed)
Vss nad trans to pacu 

## 2023-04-24 NOTE — Progress Notes (Signed)
Pt's states no medical or surgical changes since previsit or office visit. 

## 2023-04-24 NOTE — Patient Instructions (Addendum)
- Repeat colonoscopy in 5 years for surveillance.                           - Resume previous diet.                           - Continue present medications.  Internal hemorrhoids and diverticulosis. Handouts on findings given to patient.   YOU HAD AN ENDOSCOPIC PROCEDURE TODAY AT THE [facility]:   Refer to the procedure report that was given to you for any specific questions about what was found during the examination.  If the procedure report does not answer your questions, please call your gastroenterologist to clarify.  If you requested that your care partner not be given the details of your procedure findings, then the procedure report has been included in a sealed envelope for you to review at your convenience later.  YOU SHOULD EXPECT: Some feelings of bloating in the abdomen. Passage of more gas than usual.  Walking can help get rid of the air that was put into your GI tract during the procedure and reduce the bloating. If you had a lower endoscopy (such as a colonoscopy or flexible sigmoidoscopy) you may notice spotting of blood in your stool or on the toilet paper. If you underwent a bowel prep for your procedure, you may not have a normal bowel movement for a few days.  Please Note:  You might notice some irritation and congestion in your nose or some drainage.  This is from the oxygen used during your procedure.  There is no need for concern and it should clear up in a day or so.  SYMPTOMS TO REPORT IMMEDIATELY:  Following lower endoscopy (colonoscopy or flexible sigmoidoscopy):  Excessive amounts of blood in the stool  Significant tenderness or worsening of abdominal pains  Swelling of the abdomen that is new, acute  Fever of 100F or higher   For urgent or emergent issues, a gastroenterologist can be reached at any hour by calling [phone]. Do not use MyChart messaging for urgent concerns.    DIET:  We do recommend a small meal at  first, but then you may proceed to your regular diet.  Drink plenty of fluids but you should avoid alcoholic beverages for 24 hours.  ACTIVITY:  You should plan to take it easy for the rest of today and you should NOT DRIVE or use heavy machinery until tomorrow (because of the sedation medicines used during the test).    FOLLOW UP: Our staff will call the number listed on your records the next business day following your procedure.  We will call around 7:15- 8:00 am to check on you and address any questions or concerns that you may have regarding the information given to you following your procedure. If we do not reach you, we will leave a message.     If any biopsies were taken you will be contacted by phone or by letter within the next 1-3 weeks.  Please call us at [phone] if you have not heard about the biopsies in 3 weeks.    SIGNATURES/CONFIDENTIALITY: You and/or your care partner have signed paperwork which will be entered into your electronic medical record.  These signatures attest to the fact that that the information above on your After Visit Summary has been reviewed and is understood.  Full responsibility of the confidentiality of this discharge  information lies with you and/or your care-partner. 

## 2023-04-25 ENCOUNTER — Telehealth: Payer: Self-pay

## 2023-04-25 NOTE — Telephone Encounter (Signed)
  Follow up Call-     04/24/2023    8:30 AM  Call back number  Post procedure Call Back phone  # [phone]  Permission to leave phone message Yes     Patient questions:  Do you have a fever, pain , or abdominal swelling? No. Pain Score  0 *  Have you tolerated food without any problems? Yes.    Have you been able to return to your normal activities? Yes.    Do you have any questions about your discharge instructions: Diet   No. Medications  No. Follow up visit  No.  Do you have questions or concerns about your Care? No.  Actions: * If pain score is 4 or above: No action needed, pain <4.

## 2023-05-08 DIAGNOSIS — T2113XA Burn of first degree of upper back, initial encounter: Secondary | ICD-10-CM | POA: Diagnosis not present

## 2023-05-08 DIAGNOSIS — Z7984 Long term (current) use of oral hypoglycemic drugs: Secondary | ICD-10-CM | POA: Diagnosis not present

## 2023-05-08 DIAGNOSIS — Z79899 Other long term (current) drug therapy: Secondary | ICD-10-CM | POA: Diagnosis not present

## 2023-05-08 DIAGNOSIS — T22211A Burn of second degree of right forearm, initial encounter: Secondary | ICD-10-CM | POA: Diagnosis not present

## 2023-05-08 DIAGNOSIS — T310 Burns involving less than 10% of body surface: Secondary | ICD-10-CM | POA: Diagnosis not present

## 2023-05-08 DIAGNOSIS — T2007XA Burn of unspecified degree of neck, initial encounter: Secondary | ICD-10-CM | POA: Insufficient documentation

## 2023-05-08 DIAGNOSIS — T22212A Burn of second degree of left forearm, initial encounter: Secondary | ICD-10-CM | POA: Diagnosis not present

## 2023-05-08 DIAGNOSIS — Z7982 Long term (current) use of aspirin: Secondary | ICD-10-CM | POA: Insufficient documentation

## 2023-05-08 DIAGNOSIS — X118XXA Contact with other hot tap-water, initial encounter: Secondary | ICD-10-CM | POA: Insufficient documentation

## 2023-05-09 ENCOUNTER — Other Ambulatory Visit: Payer: Self-pay

## 2023-05-09 ENCOUNTER — Encounter (HOSPITAL_COMMUNITY): Payer: Self-pay | Admitting: *Deleted

## 2023-05-09 ENCOUNTER — Emergency Department (HOSPITAL_COMMUNITY)
Admission: EM | Admit: 2023-05-09 | Discharge: 2023-05-09 | Disposition: A | Discharge status: Home / Self Care | Payer: 59 | Attending: Emergency Medicine | Admitting: Emergency Medicine

## 2023-05-09 DIAGNOSIS — T300 Burn of unspecified body region, unspecified degree: Secondary | ICD-10-CM

## 2023-05-09 MED ORDER — IBUPROFEN 400 MG PO TABS
400.0000 mg | ORAL_TABLET | Freq: Once | ORAL | Status: AC
  Administered 2023-05-09: 400 mg via ORAL
  Filled 2023-05-09: qty 1

## 2023-05-09 MED ORDER — OXYCODONE-ACETAMINOPHEN 5-325 MG PO TABS
1.0000 | ORAL_TABLET | Freq: Once | ORAL | Status: AC
  Administered 2023-05-09: 1 via ORAL
  Filled 2023-05-09: qty 1

## 2023-05-09 MED ORDER — BACITRACIN ZINC 500 UNIT/GM EX OINT
TOPICAL_OINTMENT | Freq: Two times a day (BID) | CUTANEOUS | Status: DC
  Administered 2023-05-09: 1 via TOPICAL
  Filled 2023-05-09: qty 0.9
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
## 2023-07-03 DIAGNOSIS — R7989 Other specified abnormal findings of blood chemistry: Secondary | ICD-10-CM | POA: Diagnosis not present

## 2023-07-03 DIAGNOSIS — E559 Vitamin D deficiency, unspecified: Secondary | ICD-10-CM | POA: Diagnosis not present

## 2023-07-03 DIAGNOSIS — G4733 Obstructive sleep apnea (adult) (pediatric): Secondary | ICD-10-CM | POA: Diagnosis not present

## 2023-07-03 DIAGNOSIS — S40022A Contusion of left upper arm, initial encounter: Secondary | ICD-10-CM | POA: Diagnosis not present

## 2023-07-03 DIAGNOSIS — I10 Essential (primary) hypertension: Secondary | ICD-10-CM | POA: Diagnosis not present

## 2023-07-03 DIAGNOSIS — R252 Cramp and spasm: Secondary | ICD-10-CM | POA: Diagnosis not present

## 2023-07-03 DIAGNOSIS — R7303 Prediabetes: Secondary | ICD-10-CM | POA: Diagnosis not present

## 2023-07-03 DIAGNOSIS — M25561 Pain in right knee: Secondary | ICD-10-CM | POA: Diagnosis not present

## 2023-07-03 DIAGNOSIS — D509 Iron deficiency anemia, unspecified: Secondary | ICD-10-CM | POA: Diagnosis not present

## 2023-07-03 DIAGNOSIS — J4520 Mild intermittent asthma, uncomplicated: Secondary | ICD-10-CM | POA: Diagnosis not present

## 2023-07-04 DIAGNOSIS — D509 Iron deficiency anemia, unspecified: Secondary | ICD-10-CM | POA: Diagnosis not present

## 2023-07-04 DIAGNOSIS — R7303 Prediabetes: Secondary | ICD-10-CM | POA: Diagnosis not present

## 2023-07-04 DIAGNOSIS — R252 Cramp and spasm: Secondary | ICD-10-CM | POA: Diagnosis not present

## 2023-07-04 DIAGNOSIS — E559 Vitamin D deficiency, unspecified: Secondary | ICD-10-CM | POA: Diagnosis not present

## 2023-08-01 DIAGNOSIS — J4520 Mild intermittent asthma, uncomplicated: Secondary | ICD-10-CM | POA: Diagnosis not present

## 2023-08-01 DIAGNOSIS — R252 Cramp and spasm: Secondary | ICD-10-CM | POA: Diagnosis not present

## 2023-08-01 DIAGNOSIS — I10 Essential (primary) hypertension: Secondary | ICD-10-CM | POA: Diagnosis not present

## 2023-08-01 DIAGNOSIS — E559 Vitamin D deficiency, unspecified: Secondary | ICD-10-CM | POA: Diagnosis not present

## 2023-08-01 DIAGNOSIS — D509 Iron deficiency anemia, unspecified: Secondary | ICD-10-CM | POA: Diagnosis not present

## 2023-08-01 DIAGNOSIS — S40022A Contusion of left upper arm, initial encounter: Secondary | ICD-10-CM | POA: Diagnosis not present

## 2023-09-02 DIAGNOSIS — Z23 Encounter for immunization: Secondary | ICD-10-CM | POA: Diagnosis not present

## 2023-09-26 DIAGNOSIS — D509 Iron deficiency anemia, unspecified: Secondary | ICD-10-CM | POA: Diagnosis not present

## 2023-09-26 DIAGNOSIS — R7303 Prediabetes: Secondary | ICD-10-CM | POA: Diagnosis not present

## 2023-09-26 DIAGNOSIS — E559 Vitamin D deficiency, unspecified: Secondary | ICD-10-CM | POA: Diagnosis not present

## 2023-10-02 DIAGNOSIS — D509 Iron deficiency anemia, unspecified: Secondary | ICD-10-CM | POA: Diagnosis not present

## 2023-10-02 DIAGNOSIS — R7303 Prediabetes: Secondary | ICD-10-CM | POA: Diagnosis not present

## 2023-11-13 DIAGNOSIS — Z1231 Encounter for screening mammogram for malignant neoplasm of breast: Secondary | ICD-10-CM | POA: Diagnosis not present

## 2023-11-22 DIAGNOSIS — I671 Cerebral aneurysm, nonruptured: Secondary | ICD-10-CM | POA: Diagnosis not present

## 2023-11-22 DIAGNOSIS — D509 Iron deficiency anemia, unspecified: Secondary | ICD-10-CM | POA: Diagnosis not present

## 2023-11-22 DIAGNOSIS — G2581 Restless legs syndrome: Secondary | ICD-10-CM | POA: Diagnosis not present

## 2023-11-22 DIAGNOSIS — R8290 Unspecified abnormal findings in urine: Secondary | ICD-10-CM | POA: Diagnosis not present

## 2023-11-22 DIAGNOSIS — J45909 Unspecified asthma, uncomplicated: Secondary | ICD-10-CM | POA: Diagnosis not present

## 2023-11-22 DIAGNOSIS — Z1239 Encounter for other screening for malignant neoplasm of breast: Secondary | ICD-10-CM | POA: Diagnosis not present

## 2023-11-22 DIAGNOSIS — R7303 Prediabetes: Secondary | ICD-10-CM | POA: Diagnosis not present

## 2023-11-22 DIAGNOSIS — I10 Essential (primary) hypertension: Secondary | ICD-10-CM | POA: Diagnosis not present

## 2023-11-22 DIAGNOSIS — Z1211 Encounter for screening for malignant neoplasm of colon: Secondary | ICD-10-CM | POA: Diagnosis not present

## 2023-11-22 DIAGNOSIS — Z23 Encounter for immunization: Secondary | ICD-10-CM | POA: Diagnosis not present

## 2023-11-22 DIAGNOSIS — Z0001 Encounter for general adult medical examination with abnormal findings: Secondary | ICD-10-CM | POA: Diagnosis not present

## 2023-12-10 NOTE — Progress Notes (Signed)
 [facility] CONSULT NOTE  Patient Care Team: Roanna Ezekiel NOVAK, MD as PCP - General (Internal Medicine)  ASSESSMENT & PLAN:  @AGE  female with history of hysterectomy, hypertension, diabetes, obesity being seen for iron  deficiency anemia.  No problem-specific Assessment & Plan notes found for this encounter.  No orders of the defined types were placed in this encounter.   Relevant history: History of blood donation twice yearly Last colonoscopy: 04/24/23.  Multiple diverticula.  Internal hemorrhoid. Last EGD: none   The mechanism of IDA is due to either blood loss or decreased absorptive mechanism or both. We discussed some of the risks, benefits, and alternatives of intravenous iron  infusions. The patient is symptomatic from anemia and the iron  level is critically low. She tolerated oral iron  supplement poorly and desires to achieved higher levels of iron  faster for adequate hematopoesis. Some of the side-effects to be expected including risks of infusion reactions, phlebitis, headaches, nausea and fatigue.  The patient is willing to proceed. Patient education material was dispensed.   IV iron  venofer  ordered. CBC, ferritin in 4 months, a day or 2 before visit  All questions were answered. The patient knows to call the clinic with any problems, questions or concerns.  Pauletta JAYSON Chihuahua, MD 12/31/202412:17 PM   CHIEF COMPLAINTS/PURPOSE OF CONSULTATION:  Anemia  HISTORY OF PRESENTING ILLNESS:  Stephanie Best 62 y.o. female is here because of anemia.  09/26/23 hemoglobin 12 MCV 78.  RDW 15.5 Ferritin 5.  Report she has been taking daily iron  OTC for about 6 months. She has felt difference.   Stephanie Best had not noticed any recent bleeding such as melena, hematuria or hematochezia. No stomach pain, heartburn. Nausea sometimes with meat. No vomiting. No acid reflux.  Her last colonoscopy was in 04/2023. No polyps.  She has hysterectomy in 2003 due to fibroid.   She  craves ice and starch. Some bilateral leg cramps  She last donated on 11/11/23. 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   see  report in chart - no current problems   Asthma    Depression    no meds   Hypertension    Obesity    Sleep apnea    wear c pap   Vertigo 03/2014    SURGICAL HISTORY: Past Surgical History:  Procedure Laterality Date   ABDOMINAL HYSTERECTOMY     BREAST REDUCTION SURGERY  1998   CESAREAN SECTION     x 2   COLONOSCOPY  07/2012   [place] polyps   IR GENERIC HISTORICAL  02/24/2015   IR ANGIO INTRA EXTRACRAN SEL COM CAROTID INNOMINATE UNI L MOD SED 02/24/2015 Gerldine Maizes, MD [facility]   IR GENERIC HISTORICAL  02/24/2015   IR ANGIO INTRA EXTRACRAN SEL INTERNAL CAROTID UNI R MOD SED 02/24/2015 Gerldine Maizes, MD [facility]   IR GENERIC HISTORICAL  02/24/2015   IR ANGIO VERTEBRAL SEL VERTEBRAL BILAT MOD SED 02/24/2015 Gerldine Maizes, MD [facility]   KNEE ARTHROSCOPY Bilateral 1995,06/2008   x 5   SHOULDER SURGERY     tumor removal   WISDOM TOOTH EXTRACTION      SOCIAL HISTORY: Social History   Socioeconomic History   Marital status: Single    Spouse name: Not on file   Number of children: 2  Years of education: Not on file   Highest education level: Not on file  Occupational History   Not on file  Tobacco Use   Smoking status: Never   Smokeless tobacco: Never  Vaping Use   Vaping status: Never Used  Substance and Sexual Activity   Alcohol use: Not Currently    Alcohol/week: 1.0 standard drink of alcohol    Types: 1 Glasses of wine per week    Comment: Rarely wine   Drug use: No   Sexual activity: Not Currently    Birth control/protection: Post-menopausal  Other Topics Concern   Not on file  Social History Narrative   Lives with daughter in a one story home.      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    Education: college degree, currently in school.     Social Drivers of Corporate Investment Banker Strain: Not on file  Food Insecurity: Not on file  Transportation Needs: Not on file  Physical Activity: Not on file  Stress: Not on file  Social Connections: Not on file  Intimate Partner Violence: Not on file    FAMILY HISTORY: Family History  Problem Relation Age of Onset   Hypertension Mother    Cancer Father 41       Stomach cancer   Stomach cancer Father 14   Healthy Daughter        63   Other Son        Killed, 26   Colon cancer Neg Hx    Colon polyps Neg Hx    Rectal cancer Neg Hx    Crohn's disease Neg Hx    Esophageal cancer Neg Hx    Ulcerative colitis Neg Hx     ALLERGIES:  has no known allergies.  MEDICATIONS:  Current Outpatient Medications  Medication Sig Dispense Refill   albuterol  (PROVENTIL  HFA;VENTOLIN  HFA) 108 (90 Base) MCG/ACT inhaler Inhale 2 puffs into the lungs every 4 (four) hours as needed for wheezing or shortness of breath. 1 Inhaler 0   amLODipine  (NORVASC ) 10 MG tablet Take 1 tablet by mouth daily.     aspirin EC 81 MG tablet Take 81 mg by mouth daily.  (Patient not taking: Reported on 03/25/2023)     cetirizine (ZYRTEC) 10 MG tablet Take 10 mg by mouth daily as needed for allergies.     clonazePAM (KLONOPIN) 0.5 MG tablet Take 1 tablet by mouth daily as needed for anxiety.  (Patient not taking: Reported on 04/24/2023)     fluticasone  furoate-vilanterol (BREO ELLIPTA ) 100-25 MCG/INH AEPB Inhale 1 puff into the lungs daily. (Patient not taking: Reported on 03/25/2023) 60 each 11   iron  polysaccharides (FERREX 150) 150 MG capsule Ferrex 150 mg iron  capsule  TAKE 1 CAPSULE BY MOUTH TWICE DAILY     lisinopril -hydrochlorothiazide  (ZESTORETIC ) 10-12.5 MG tablet Take 1 tablet by mouth daily. (Patient not taking: Reported on 03/25/2023)     meclizine  (ANTIVERT ) 12.5 MG tablet TAKE 1 TABLET  BY MOUTH THREE TIMES DAILY AS NEEDED FOR DIZZINESS (Patient not taking: Reported on 04/24/2023) 30 tablet 0   metFORMIN (GLUCOPHAGE-XR) 500 MG 24 hr tablet  (Patient not taking: Reported on 04/24/2023)     methocarbamol (ROBAXIN) 500 MG tablet methocarbamol 500 mg tablet (Patient not taking: Reported on 04/24/2023)     phentermine 37.5 MG capsule      No current facility-administered medications for this visit.    REVIEW OF SYSTEMS:   All relevant  systems were reviewed with the patient and are negative.  PHYSICAL EXAMINATION: ECOG PERFORMANCE STATUS: 0 - Asymptomatic  There were no vitals filed for this visit. There were no vitals filed for this visit.  GENERAL: alert, no distress and comfortable SKIN: skin color normal EYES: normal conjunctiva, sclera clear LUNGS: normal breathing effort ABDOMEN: abdomen soft, non-tender and nondistended  RADIOGRAPHIC STUDIES: I have personally reviewed the radiological images as listed and agreed with the findings in the report. No results found.

## 2023-12-12 ENCOUNTER — Other Ambulatory Visit: Payer: 59

## 2023-12-12 ENCOUNTER — Telehealth: Payer: Self-pay

## 2023-12-12 ENCOUNTER — Inpatient Hospital Stay: Payer: 59

## 2023-12-12 ENCOUNTER — Other Ambulatory Visit: Payer: Self-pay

## 2023-12-12 VITALS — BP 146/79 | HR 84 | Wt 217.2 lb

## 2023-12-12 DIAGNOSIS — Z90710 Acquired absence of both cervix and uterus: Secondary | ICD-10-CM | POA: Insufficient documentation

## 2023-12-12 DIAGNOSIS — D500 Iron deficiency anemia secondary to blood loss (chronic): Secondary | ICD-10-CM | POA: Diagnosis not present

## 2023-12-12 DIAGNOSIS — R42 Dizziness and giddiness: Secondary | ICD-10-CM

## 2023-12-12 DIAGNOSIS — D509 Iron deficiency anemia, unspecified: Secondary | ICD-10-CM | POA: Insufficient documentation

## 2023-12-12 DIAGNOSIS — D649 Anemia, unspecified: Secondary | ICD-10-CM

## 2023-12-12 LAB — CBC WITH DIFFERENTIAL (CANCER CENTER ONLY)
Abs Immature Granulocytes: 0.02 10*3/uL (ref 0.00–0.07)
Basophils Absolute: 0 10*3/uL (ref 0.0–0.1)
Basophils Relative: 1 %
Eosinophils Absolute: 0.1 10*3/uL (ref 0.0–0.5)
Eosinophils Relative: 2 %
HCT: 33 % — ABNORMAL LOW (ref 36.0–46.0)
Hemoglobin: 10.5 g/dL — ABNORMAL LOW (ref 12.0–15.0)
Immature Granulocytes: 0 %
Lymphocytes Relative: 28 %
Lymphs Abs: 1.6 10*3/uL (ref 0.7–4.0)
MCH: 24.1 pg — ABNORMAL LOW (ref 26.0–34.0)
MCHC: 31.8 g/dL (ref 30.0–36.0)
MCV: 75.9 fL — ABNORMAL LOW (ref 80.0–100.0)
Monocytes Absolute: 0.5 10*3/uL (ref 0.1–1.0)
Monocytes Relative: 9 %
Neutro Abs: 3.4 10*3/uL (ref 1.7–7.7)
Neutrophils Relative %: 60 %
Platelet Count: 383 10*3/uL (ref 150–400)
RBC: 4.35 MIL/uL (ref 3.87–5.11)
RDW: 14.3 % (ref 11.5–15.5)
WBC Count: 5.7 10*3/uL (ref 4.0–10.5)
nRBC: 0 % (ref 0.0–0.2)

## 2023-12-12 LAB — FERRITIN: Ferritin: 4 ng/mL — ABNORMAL LOW (ref 11–307)

## 2023-12-12 MED ORDER — MECLIZINE HCL 12.5 MG PO TABS
12.5000 mg | ORAL_TABLET | Freq: Every day | ORAL | Status: AC | PRN
Start: 2023-12-12 — End: ?

## 2023-12-12 NOTE — Telephone Encounter (Signed)
 Dr. Tina, patient will be scheduled as soon as possible.  Auth Submission: NO AUTH NEEDED Site of care: Site of care: [facility] Payer: [employer] dual complete Medication & CPT/J Code(s) submitted: Venofer  (Iron  Sucrose) J1756 Route of submission (phone, fax, portal):  Phone # Fax # Auth type: Buy/Bill PB Units/visits requested: 200mg  x 4 doses Reference number:  Approval from: 12/12/23 to 12/09/24

## 2023-12-19 ENCOUNTER — Ambulatory Visit (INDEPENDENT_AMBULATORY_CARE_PROVIDER_SITE_OTHER): Payer: 59

## 2023-12-19 VITALS — BP 139/76 | HR 83 | Temp 99.0°F | Resp 18 | Ht 64.0 in | Wt 218.2 lb

## 2023-12-19 DIAGNOSIS — D509 Iron deficiency anemia, unspecified: Secondary | ICD-10-CM | POA: Diagnosis not present

## 2023-12-19 MED ORDER — DIPHENHYDRAMINE HCL 25 MG PO CAPS
25.0000 mg | ORAL_CAPSULE | Freq: Once | ORAL | Status: AC
  Administered 2023-12-19: 25 mg via ORAL
  Filled 2023-12-19: qty 1

## 2023-12-19 MED ORDER — IRON SUCROSE 20 MG/ML IV SOLN
200.0000 mg | Freq: Once | INTRAVENOUS | Status: AC
Start: 2023-12-19 — End: 2023-12-19
  Administered 2023-12-19: 200 mg via INTRAVENOUS
  Filled 2023-12-19: qty 10

## 2023-12-19 MED ORDER — ACETAMINOPHEN 325 MG PO TABS
650.0000 mg | ORAL_TABLET | Freq: Once | ORAL | Status: AC
  Administered 2023-12-19: 650 mg via ORAL
  Filled 2023-12-19: qty 2

## 2023-12-19 NOTE — Progress Notes (Signed)
 Diagnosis: Iron  Deficiency Anemia  Provider:  Praveen Mannam MD  Procedure: IV Push  IV Type: Peripheral, IV Location: L Antecubital  Venofer  (Iron  Sucrose), Dose: 200 mg  Post Infusion IV Care: Observation period completed and Peripheral IV Discontinued  Discharge: Condition: Good, Destination: Home . AVS Declined  Performed by:  Maximiano JONELLE Pouch, LPN

## 2023-12-26 ENCOUNTER — Ambulatory Visit: Payer: 59

## 2023-12-26 VITALS — BP 132/76 | HR 82 | Temp 98.0°F | Resp 20 | Ht 64.0 in | Wt 216.6 lb

## 2023-12-26 DIAGNOSIS — D509 Iron deficiency anemia, unspecified: Secondary | ICD-10-CM

## 2023-12-26 MED ORDER — ACETAMINOPHEN 325 MG PO TABS
650.0000 mg | ORAL_TABLET | Freq: Once | ORAL | Status: AC
  Administered 2023-12-26: 650 mg via ORAL
  Filled 2023-12-26: qty 2

## 2023-12-26 MED ORDER — DIPHENHYDRAMINE HCL 25 MG PO CAPS
25.0000 mg | ORAL_CAPSULE | Freq: Once | ORAL | Status: AC
  Administered 2023-12-26: 25 mg via ORAL
  Filled 2023-12-26: qty 1

## 2023-12-26 MED ORDER — IRON SUCROSE 20 MG/ML IV SOLN
200.0000 mg | Freq: Once | INTRAVENOUS | Status: AC
Start: 2023-12-26 — End: 2023-12-26
  Administered 2023-12-26: 200 mg via INTRAVENOUS
  Filled 2023-12-26: qty 10

## 2023-12-26 NOTE — Progress Notes (Signed)
 Diagnosis: Iron Deficiency Anemia  Provider:  Chilton Greathouse MD  Procedure: IV Push  IV Type: Peripheral, IV Location: L Antecubital  Venofer (Iron Sucrose), Dose: 200 mg  Post Infusion IV Care: Observation period completed and Peripheral IV Discontinued  Discharge: Condition: Good, Destination: Home . AVS Declined  Performed by:  Rico Ala, LPN

## 2024-01-02 ENCOUNTER — Ambulatory Visit: Payer: 59

## 2024-01-02 MED ORDER — ACETAMINOPHEN 325 MG PO TABS: 650.0000 mg | ORAL_TABLET | Freq: Once | ORAL | Status: DC

## 2024-01-02 MED ORDER — DIPHENHYDRAMINE HCL 25 MG PO CAPS
25.0000 mg | ORAL_CAPSULE | Freq: Once | ORAL | Status: DC
Start: 2024-01-02 — End: 2024-01-16

## 2024-01-02 MED ORDER — IRON SUCROSE 20 MG/ML IV SOLN: 200.0000 mg | Freq: Once | INTRAVENOUS | Status: DC

## 2024-01-09 ENCOUNTER — Ambulatory Visit: Payer: 59

## 2024-01-09 VITALS — BP 132/78 | HR 75 | Temp 97.9°F | Resp 18 | Ht 64.0 in | Wt 214.6 lb

## 2024-01-09 DIAGNOSIS — D509 Iron deficiency anemia, unspecified: Secondary | ICD-10-CM | POA: Diagnosis not present

## 2024-01-09 MED ORDER — ACETAMINOPHEN 325 MG PO TABS
650.0000 mg | ORAL_TABLET | Freq: Once | ORAL | Status: AC
  Administered 2024-01-09: 650 mg via ORAL
  Filled 2024-01-09: qty 2

## 2024-01-09 MED ORDER — IRON SUCROSE 20 MG/ML IV SOLN
200.0000 mg | Freq: Once | INTRAVENOUS | Status: AC
  Administered 2024-01-09: 200 mg via INTRAVENOUS
  Filled 2024-01-09: qty 10

## 2024-01-09 MED ORDER — DIPHENHYDRAMINE HCL 25 MG PO CAPS
25.0000 mg | ORAL_CAPSULE | Freq: Once | ORAL | Status: AC
Start: 2024-01-09 — End: 2024-01-09
  Administered 2024-01-09: 25 mg via ORAL
  Filled 2024-01-09: qty 1

## 2024-01-09 NOTE — Progress Notes (Signed)
Diagnosis: Iron Deficiency Anemia  Provider:  Chilton Greathouse MD  Procedure: IV Push  IV Type: Peripheral, IV Location: L Antecubital  Venofer (Iron Sucrose), Dose: 200 mg  Post Infusion IV Care: Observation period completed and Peripheral IV Discontinued  Discharge: Condition: Good, Destination: Home . AVS Declined  Performed by:  Rico Ala, LPN

## 2024-01-16 MED ORDER — DIPHENHYDRAMINE HCL 25 MG PO CAPS
25.0000 mg | ORAL_CAPSULE | Freq: Once | ORAL | Status: DC
Start: 2024-01-16 — End: 2024-01-16

## 2024-01-16 MED ORDER — SODIUM CHLORIDE 0.9 % IV BOLUS
250.0000 mL | Freq: Once | INTRAVENOUS | Status: DC
  Filled 2024-01-16: qty 250

## 2024-01-16 MED ORDER — ACETAMINOPHEN 325 MG PO TABS
650.0000 mg | ORAL_TABLET | Freq: Once | ORAL | Status: DC
Start: 2024-01-16 — End: 2024-01-16

## 2024-01-16 MED ORDER — IRON SUCROSE 20 MG/ML IV SOLN: 200.0000 mg | Freq: Once | INTRAVENOUS | Status: DC

## 2024-01-23 ENCOUNTER — Ambulatory Visit: Payer: 59

## 2024-01-23 VITALS — BP 159/83 | HR 72 | Temp 98.4°F | Resp 18 | Ht 64.0 in | Wt 216.2 lb

## 2024-01-23 DIAGNOSIS — D509 Iron deficiency anemia, unspecified: Secondary | ICD-10-CM

## 2024-01-23 MED ORDER — DIPHENHYDRAMINE HCL 25 MG PO CAPS
25.0000 mg | ORAL_CAPSULE | Freq: Once | ORAL | Status: AC
  Administered 2024-01-23: 25 mg via ORAL
  Filled 2024-01-23: qty 1

## 2024-01-23 MED ORDER — ACETAMINOPHEN 325 MG PO TABS
650.0000 mg | ORAL_TABLET | Freq: Once | ORAL | Status: AC
  Administered 2024-01-23: 650 mg via ORAL
  Filled 2024-01-23: qty 2

## 2024-01-23 MED ORDER — IRON SUCROSE 20 MG/ML IV SOLN
200.0000 mg | Freq: Once | INTRAVENOUS | Status: AC
  Administered 2024-01-23: 200 mg via INTRAVENOUS
  Filled 2024-01-23: qty 10

## 2024-01-23 NOTE — Progress Notes (Signed)
Diagnosis: Iron Deficiency Anemia  Provider:  Chilton Greathouse MD  Procedure: IV Push  IV Type: Peripheral, IV Location: L Antecubital  Venofer (Iron Sucrose), Dose: 200 mg  Post Infusion IV Care: Observation period completed and Peripheral IV Discontinued  Discharge: Condition: Good, Destination: Home . AVS Declined  Performed by:  Rico Ala, LPN

## 2024-03-03 DIAGNOSIS — I10 Essential (primary) hypertension: Secondary | ICD-10-CM | POA: Diagnosis not present

## 2024-03-03 DIAGNOSIS — Z13220 Encounter for screening for lipoid disorders: Secondary | ICD-10-CM | POA: Diagnosis not present

## 2024-03-03 DIAGNOSIS — Z136 Encounter for screening for cardiovascular disorders: Secondary | ICD-10-CM | POA: Diagnosis not present

## 2024-03-03 DIAGNOSIS — R7303 Prediabetes: Secondary | ICD-10-CM | POA: Diagnosis not present

## 2024-03-13 DIAGNOSIS — J4520 Mild intermittent asthma, uncomplicated: Secondary | ICD-10-CM | POA: Diagnosis not present

## 2024-03-13 DIAGNOSIS — I10 Essential (primary) hypertension: Secondary | ICD-10-CM | POA: Diagnosis not present

## 2024-03-13 DIAGNOSIS — D509 Iron deficiency anemia, unspecified: Secondary | ICD-10-CM | POA: Diagnosis not present

## 2024-03-13 DIAGNOSIS — R7303 Prediabetes: Secondary | ICD-10-CM | POA: Diagnosis not present

## 2024-03-13 DIAGNOSIS — G2581 Restless legs syndrome: Secondary | ICD-10-CM | POA: Diagnosis not present

## 2024-04-08 ENCOUNTER — Inpatient Hospital Stay: Payer: 59

## 2024-04-08 DIAGNOSIS — D500 Iron deficiency anemia secondary to blood loss (chronic): Secondary | ICD-10-CM

## 2024-04-08 DIAGNOSIS — D509 Iron deficiency anemia, unspecified: Secondary | ICD-10-CM | POA: Insufficient documentation

## 2024-04-08 LAB — CBC WITH DIFFERENTIAL (CANCER CENTER ONLY)
Abs Immature Granulocytes: 0.01 10*3/uL (ref 0.00–0.07)
Basophils Absolute: 0 10*3/uL (ref 0.0–0.1)
Basophils Relative: 1 %
Eosinophils Absolute: 0.1 10*3/uL (ref 0.0–0.5)
Eosinophils Relative: 2 %
HCT: 40 % (ref 36.0–46.0)
Hemoglobin: 13.2 g/dL (ref 12.0–15.0)
Immature Granulocytes: 0 %
Lymphocytes Relative: 37 %
Lymphs Abs: 1.8 10*3/uL (ref 0.7–4.0)
MCH: 26.3 pg (ref 26.0–34.0)
MCHC: 33 g/dL (ref 30.0–36.0)
MCV: 79.8 fL — ABNORMAL LOW (ref 80.0–100.0)
Monocytes Absolute: 0.5 10*3/uL (ref 0.1–1.0)
Monocytes Relative: 10 %
Neutro Abs: 2.5 10*3/uL (ref 1.7–7.7)
Neutrophils Relative %: 50 %
Platelet Count: 288 10*3/uL (ref 150–400)
RBC: 5.01 MIL/uL (ref 3.87–5.11)
RDW: 15.4 % (ref 11.5–15.5)
WBC Count: 4.9 10*3/uL (ref 4.0–10.5)
nRBC: 0 % (ref 0.0–0.2)

## 2024-04-08 LAB — FERRITIN: Ferritin: 32 ng/mL (ref 11–307)

## 2024-04-09 NOTE — Progress Notes (Signed)
 [name]  Patient Care Team: [name], MD as PCP - General (Internal Medicine)  ASSESSMENT & PLAN:  63 y.o. female with history of hysterectomy, hypertension, diabetes, obesity being seen for iron  deficiency anemia.  4 doses of iv venofer  from 1/9 to 01/23/24. Hgb now normal. Iron  storage is low normal. Microcytosis persist, though her MCV was normal when she had normal hemoglobin. Recommend two more dose of iv venofer .  Iv iron  ordered.  Iron  deficiency anemia No blood donation Two dose of IV venofer    Relevant history: History of blood donation twice yearly Last colonoscopy: 04/24/23.  Multiple diverticula.  Internal hemorrhoid. Last EGD: none  Follow up as needed.  All questions were answered. The patient knows to call the clinic with any problems, questions or concerns.  Lowanda Ruddy, MD 5/2/20259:47 AM   CHIEF COMPLAINTS/PURPOSE OF CONSULTATION:  Anemia  HISTORY OF PRESENTING ILLNESS:  Stephanie Best 63 y.o. female is here because of anemia. She received IV venofer  after first visit. Overall feeling well. She tolerated iron  well. No craving for unusual thing.   No bloody stool or dark stool.    Initial history: 09/26/23 hemoglobin 12 MCV 78.  RDW 15.5 Ferritin 5.  Report she has been taking daily iron  OTC for about 6 months. She has felt difference.   [name] had not noticed any recent bleeding such as melena, hematuria or hematochezia. No stomach pain, heartburn. Nausea sometimes with meat. No vomiting. No acid reflux.  Her last colonoscopy was in 04/2023. No polyps.  She has hysterectomy in 2003 due to fibroid.   She craves ice and starch. Some bilateral leg cramps  She last donated on 11/11/23. 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   see  report in  chart - no current problems   Asthma    Depression    no meds   Hypertension    Obesity    Sleep apnea    wear c pap   Vertigo 03/2014    SURGICAL HISTORY: Past Surgical History:  Procedure Laterality Date   ABDOMINAL HYSTERECTOMY     BREAST REDUCTION SURGERY  1998   CESAREAN SECTION     x 2   COLONOSCOPY  07/2012   Elvin Hammer polyps   IR GENERIC HISTORICAL  02/24/2015   IR ANGIO INTRA EXTRACRAN SEL COM CAROTID INNOMINATE UNI L MOD SED 02/24/2015 Augusto Blonder, MD [facility]   IR GENERIC HISTORICAL  02/24/2015   IR ANGIO INTRA EXTRACRAN SEL INTERNAL CAROTID UNI R MOD SED 02/24/2015 Augusto Blonder, MD [facility]   IR GENERIC HISTORICAL  02/24/2015   IR ANGIO VERTEBRAL SEL VERTEBRAL BILAT MOD SED 02/24/2015 Augusto Blonder, MD [facility]   KNEE ARTHROSCOPY Bilateral 1995,06/2008   x 5   SHOULDER SURGERY     tumor removal   WISDOM TOOTH EXTRACTION      ALLERGIES:  has no known allergies.  MEDICATIONS:  Current Outpatient Medications  Medication Sig Dispense Refill   albuterol  (PROVENTIL  HFA;VENTOLIN  HFA) 108 (90 Base) MCG/ACT inhaler Inhale 2 puffs into the lungs every 4 (four) hours as needed for wheezing or shortness of breath. 1 Inhaler 0   amLODipine  (NORVASC ) 10 MG tablet Take 1 tablet by mouth daily.     cetirizine (ZYRTEC) 10  MG tablet Take 10 mg by mouth daily as needed for allergies.     fluticasone  furoate-vilanterol (BREO ELLIPTA ) 100-25 MCG/INH AEPB Inhale 1 puff into the lungs daily. (Patient not taking: Reported on 03/25/2023) 60 each 11   iron  polysaccharides (FERREX 150) 150 MG capsule Ferrex 150 mg iron  capsule  TAKE 1 CAPSULE BY MOUTH TWICE DAILY     meclizine  (ANTIVERT ) 12.5 MG tablet Take 1 tablet (12.5 mg total) by mouth daily as needed for dizziness.     metFORMIN (GLUCOPHAGE-XR) 500 MG 24 hr tablet  (Patient not taking: Reported on 04/24/2023)     methocarbamol (ROBAXIN) 500 MG tablet methocarbamol 500 mg tablet (Patient not taking: Reported on  04/24/2023)     No current facility-administered medications for this visit.    REVIEW OF SYSTEMS:   All relevant systems were reviewed with the patient and are negative.  PHYSICAL EXAMINATION: ECOG PERFORMANCE STATUS: 0 - Asymptomatic  Vitals:   04/10/24 0928  BP: (!) 156/71  Pulse: 63  Resp: 18  Temp: 97.7 F (36.5 C)  SpO2: 100%   Filed Weights   04/10/24 0928  Weight: 215 lb (97.5 kg)    GENERAL: alert, no distress and comfortable

## 2024-04-09 NOTE — Assessment & Plan Note (Addendum)
 No blood donation Two dose of IV venofer 

## 2024-04-10 ENCOUNTER — Telehealth: Payer: Self-pay

## 2024-04-10 ENCOUNTER — Inpatient Hospital Stay: Payer: 59

## 2024-04-10 VITALS — BP 156/71 | HR 63 | Temp 97.7°F | Resp 18 | Wt 215.0 lb

## 2024-04-10 DIAGNOSIS — Z90710 Acquired absence of both cervix and uterus: Secondary | ICD-10-CM | POA: Diagnosis not present

## 2024-04-10 DIAGNOSIS — D508 Other iron deficiency anemias: Secondary | ICD-10-CM

## 2024-04-10 DIAGNOSIS — D509 Iron deficiency anemia, unspecified: Secondary | ICD-10-CM | POA: Insufficient documentation

## 2024-04-10 NOTE — Telephone Encounter (Signed)
 Dr. Alita Irwin, patient will be scheduled as soon as possible.  Auth Submission: NO AUTH NEEDED Site of care: Site of care: [facility] Payer: [employer] dual complete Medication & CPT/J Code(s) submitted: Venofer  (Iron  Sucrose) J1756 Route of submission (phone, fax, portal):  Phone # Fax # Auth type: Buy/Bill PB Units/visits requested: 200mg  x 2 doses Reference number:  Approval from: 04/10/24 to 09/10/24

## 2024-04-23 ENCOUNTER — Ambulatory Visit

## 2024-04-23 VITALS — BP 147/81 | HR 75 | Temp 98.3°F | Resp 18 | Ht 64.0 in | Wt 214.0 lb

## 2024-04-23 DIAGNOSIS — D509 Iron deficiency anemia, unspecified: Secondary | ICD-10-CM | POA: Diagnosis not present

## 2024-04-23 MED ORDER — IRON SUCROSE 20 MG/ML IV SOLN
200.0000 mg | Freq: Once | INTRAVENOUS | Status: AC
  Administered 2024-04-23: 200 mg via INTRAVENOUS
  Filled 2024-04-23: qty 10

## 2024-04-23 NOTE — Progress Notes (Signed)
 Diagnosis: Iron Deficiency Anemia  Provider:  Chilton Greathouse MD  Procedure: IV Push  IV Type: Peripheral, IV Location: R Antecubital  Venofer (Iron Sucrose), Dose: 200 mg  Post Infusion IV Care: Observation period completed and Peripheral IV Discontinued  Discharge: Condition: Good, Destination: Home . AVS Declined  Performed by:  Rico Ala, LPN

## 2024-05-07 ENCOUNTER — Ambulatory Visit

## 2024-05-07 VITALS — BP 154/81 | HR 67 | Temp 98.1°F | Resp 18 | Ht 64.0 in | Wt 217.4 lb

## 2024-05-07 DIAGNOSIS — D509 Iron deficiency anemia, unspecified: Secondary | ICD-10-CM | POA: Diagnosis not present

## 2024-05-07 MED ORDER — IRON SUCROSE 20 MG/ML IV SOLN
200.0000 mg | Freq: Once | INTRAVENOUS | Status: AC
  Administered 2024-05-07: 200 mg via INTRAVENOUS
  Filled 2024-05-07: qty 10

## 2024-05-07 NOTE — Progress Notes (Signed)
 Diagnosis: Iron Deficiency Anemia  Provider:  Chilton Greathouse MD  Procedure: IV Push  IV Type: Peripheral, IV Location: L Antecubital  Venofer (Iron Sucrose), Dose: 200 mg  Post Infusion IV Care: Patient declined observation and Peripheral IV Discontinued  Discharge: Condition: Good, Destination: Home . AVS Declined  Performed by:  Rico Ala, LPN

## 2024-08-14 DIAGNOSIS — M79672 Pain in left foot: Secondary | ICD-10-CM | POA: Diagnosis not present

## 2024-09-03 DIAGNOSIS — Z0001 Encounter for general adult medical examination with abnormal findings: Secondary | ICD-10-CM | POA: Diagnosis not present

## 2024-09-03 DIAGNOSIS — Z131 Encounter for screening for diabetes mellitus: Secondary | ICD-10-CM | POA: Diagnosis not present

## 2024-09-09 DIAGNOSIS — Z1239 Encounter for other screening for malignant neoplasm of breast: Secondary | ICD-10-CM | POA: Diagnosis not present

## 2024-09-09 DIAGNOSIS — I10 Essential (primary) hypertension: Secondary | ICD-10-CM | POA: Diagnosis not present

## 2024-09-09 DIAGNOSIS — Z23 Encounter for immunization: Secondary | ICD-10-CM | POA: Diagnosis not present

## 2024-09-09 DIAGNOSIS — D509 Iron deficiency anemia, unspecified: Secondary | ICD-10-CM | POA: Diagnosis not present

## 2024-09-09 DIAGNOSIS — J4520 Mild intermittent asthma, uncomplicated: Secondary | ICD-10-CM | POA: Diagnosis not present

## 2024-09-09 DIAGNOSIS — I671 Cerebral aneurysm, nonruptured: Secondary | ICD-10-CM | POA: Diagnosis not present

## 2024-09-09 DIAGNOSIS — Z0001 Encounter for general adult medical examination with abnormal findings: Secondary | ICD-10-CM | POA: Diagnosis not present

## 2024-09-09 DIAGNOSIS — Z1211 Encounter for screening for malignant neoplasm of colon: Secondary | ICD-10-CM | POA: Diagnosis not present

## 2024-09-09 DIAGNOSIS — M1711 Unilateral primary osteoarthritis, right knee: Secondary | ICD-10-CM | POA: Diagnosis not present

## 2024-09-17 DIAGNOSIS — M1711 Unilateral primary osteoarthritis, right knee: Secondary | ICD-10-CM | POA: Diagnosis not present
